# Patient Record
Sex: Female | Born: 1995 | Hispanic: Yes | Marital: Single | Smoking: Never smoker
Health system: Southern US, Community
[De-identification: ages and names within clinical notes are randomized; demographics above are authoritative.]

## PROBLEM LIST (undated history)

## (undated) DIAGNOSIS — J45909 Unspecified asthma, uncomplicated: Secondary | ICD-10-CM

## (undated) HISTORY — PX: NO PAST SURGERIES: SHX2092

---

## 2017-03-24 ENCOUNTER — Ambulatory Visit (INDEPENDENT_AMBULATORY_CARE_PROVIDER_SITE_OTHER): Payer: Self-pay | Admitting: Physician Assistant

## 2017-03-24 ENCOUNTER — Telehealth: Payer: Self-pay | Admitting: General Practice

## 2017-03-24 VITALS — BP 119/76 | HR 58 | Temp 98.1°F | Resp 16 | Ht 62.0 in | Wt 239.8 lb

## 2017-03-24 DIAGNOSIS — J22 Unspecified acute lower respiratory infection: Secondary | ICD-10-CM

## 2017-03-24 DIAGNOSIS — J45909 Unspecified asthma, uncomplicated: Secondary | ICD-10-CM

## 2017-03-24 DIAGNOSIS — J45901 Unspecified asthma with (acute) exacerbation: Secondary | ICD-10-CM

## 2017-03-24 MED ORDER — BENZONATATE 100 MG PO CAPS: 100.0000 mg | ORAL_CAPSULE | Freq: Three times a day (TID) | ORAL | 0 refills | Status: DC | PRN

## 2017-03-24 MED ORDER — GUAIFENESIN ER 1200 MG PO TB12: 1.0000 | ORAL_TABLET | Freq: Two times a day (BID) | ORAL | 1 refills | Status: DC | PRN

## 2017-03-24 MED ORDER — ALBUTEROL SULFATE HFA 108 (90 BASE) MCG/ACT IN AERS: 2.0000 | INHALATION_SPRAY | Freq: Four times a day (QID) | RESPIRATORY_TRACT | 0 refills | Status: AC | PRN

## 2017-03-24 MED ORDER — AZITHROMYCIN 250 MG PO TABS: ORAL_TABLET | ORAL | 0 refills | Status: DC

## 2017-03-24 NOTE — Telephone Encounter (Signed)
Pt just saw stephanie and when she got the pharmacy it stated that they did not receive an rx for an inhaler   Best number [phone]

## 2017-03-24 NOTE — Patient Instructions (Addendum)
I am treating you for a lower respiratory infection.  I am listing some information below about asthma. Take the antibiotic as prescribed.   Make sure you are hydrating well with water.    Asthma, Adult Asthma is a condition of the lungs in which the airways tighten and narrow. Asthma can make it hard to breathe. Asthma cannot be cured, but medicine and lifestyle changes can help control it. Asthma may be started (triggered) by:  Animal skin flakes (dander).  Dust.  Cockroaches.  Pollen.  Mold.  Smoke.  Cleaning products.  Hair sprays or aerosol sprays.  Paint fumes or strong smells.  Cold air, weather changes, and winds.  Crying or laughing hard.  Stress.  Certain medicines or drugs.  Foods, such as dried fruit, potato chips, and sparkling grape juice.  Infections or conditions (colds, flu).  Exercise.  Certain medical conditions or diseases.  Exercise or tiring activities. Follow these instructions at home:  Take medicine as told by your doctor.  Use a peak flow meter as told by your doctor. A peak flow meter is a tool that measures how well the lungs are working.  Record and keep track of the peak flow meter's readings.  Understand and use the asthma action plan. An asthma action plan is a written plan for taking care of your asthma and treating your attacks.  To help prevent asthma attacks:  Do not smoke. Stay away from secondhand smoke.  Change your heating and air conditioning filter often.  Limit your use of fireplaces and wood stoves.  Get rid of pests (such as roaches and mice) and their droppings.  Throw away plants if you see mold on them.  Clean your floors. Dust regularly. Use cleaning products that do not smell.  Have someone vacuum when you are not home. Use a vacuum cleaner with a HEPA filter if possible.  Replace carpet with wood, tile, or vinyl flooring. Carpet can trap animal skin flakes and dust.  Use allergy-proof pillows,  mattress covers, and box spring covers.  Wash bed sheets and blankets every week in hot water and dry them in a dryer.  Use blankets that are made of polyester or cotton.  Clean bathrooms and kitchens with bleach. If possible, have someone repaint the walls in these rooms with mold-resistant paint. Keep out of the rooms that are being cleaned and painted.  Wash hands often. Contact a doctor if:  You have make a whistling sound when breaking (wheeze), have shortness of breath, or have a cough even if taking medicine to prevent attacks.  The colored mucus you cough up (sputum) is thicker than usual.  The colored mucus you cough up changes from clear or white to yellow, green, gray, or bloody.  You have problems from the medicine you are taking such as:  A rash.  Itching.  Swelling.  Trouble breathing.  You need reliever medicines more than 2-3 times a week.  Your peak flow measurement is still at 50-79% of your personal best after following the action plan for 1 hour.  You have a fever. Get help right away if:  You seem to be worse and are not responding to medicine during an asthma attack.  You are short of breath even at rest.  You get short of breath when doing very little activity.  You have trouble eating, drinking, or talking.  You have chest pain.  You have a fast heartbeat.  Your lips or fingernails start to turn blue.  You are light-headed, dizzy, or faint.  Your peak flow is less than 50% of your personal best. This information is not intended to replace advice given to you by your health care provider. Make sure you discuss any questions you have with your health care provider. Document Released: 05/19/2008 Document Revised: 05/08/2016 Document Reviewed: 06/30/2013 Elsevier Interactive Patient Education  2017 [employer].     IF you received an x-ray today, you will receive an invoice from [facility]. Please contact [facility]  at [phone] with questions or concerns regarding your invoice.   IF you received labwork today, you will receive an invoice from [place]. Please contact [facility] at [phone] with questions or concerns regarding your invoice.   Our billing staff will not be able to assist you with questions regarding bills from these companies.  You will be contacted with the lab results as soon as they are available. The fastest way to get your results is to activate your My Chart account. Instructions are located on the last page of this paperwork. If you have not heard from [place] regarding the results in 2 weeks, please contact this office.

## 2017-03-25 NOTE — Progress Notes (Signed)
PRIMARY CARE AT [facility] [address] [place] [address] [phone]  Date:  03/24/2017   Name:  Vanessa Nunez   DOB:  [birth year]   MRN:  [id number]  PCP:  No PCP Per Patient    History of Present Illness:  Vanessa Nunez is a 21 y.o. female patient who presents to PCP with  Chief Complaint  Patient presents with  . Sore Throat    fever, dizziness, headache, cough, nasal/chest congestion, since Wednesday      Patient reports 7 days of fever, headache and cough.  Cough is productive of a yellow sputum.   Productive cough.  No sneezing or watery eyes.  Ear discomfort.  Subjective fever and chills.   Patient may use albuterol as needed for her asthma at least once per day.   Since her symptoms started she has needed to use her inhaler 3-4 times per day.  She ran out of her inhaler yesterday.    There are no active problems to display for this patient.   No past medical history on file.  No past surgical history on file.  Social History  Substance Use Topics  . Smoking status: Never Smoker  . Smokeless tobacco: Never Used  . Alcohol use No    Family History  Problem Relation Age of Onset  . Cancer Paternal Grandmother     No Known Allergies  Medication list has been reviewed and updated.  No current outpatient prescriptions on file prior to visit.   No current facility-administered medications on file prior to visit.     ROS ROS otherwise unremarkable unless listed above.  Physical Examination: BP 119/76 (BP Location: Right Arm, Patient Position: Sitting, Cuff Size: Small)   Pulse (!) 58   Temp 98.1 F (36.7 C) (Oral)   Resp 16   Ht  (1.575 m)   Wt 239 lb 12.8 oz (108.8 kg)   LMP 03/04/2017   SpO2 100%   BMI 43.86 kg/m  Ideal Body Weight: Weight in (lb) to have BMI = 25: 136.4  Physical Exam  Constitutional: She is oriented to person, place, and time. She appears well-developed and well-nourished. No distress.  HENT:  Head:  Normocephalic and atraumatic.  Right Ear: Tympanic membrane, external ear and ear canal normal.  Left Ear: Tympanic membrane, external ear and ear canal normal.  Nose: Mucosal edema and rhinorrhea present. Right sinus exhibits no maxillary sinus tenderness and no frontal sinus tenderness. Left sinus exhibits no maxillary sinus tenderness and no frontal sinus tenderness.  Mouth/Throat: No uvula swelling. No oropharyngeal exudate, posterior oropharyngeal edema or posterior oropharyngeal erythema.  Eyes: Conjunctivae and EOM are normal. Pupils are equal, round, and reactive to light.  Cardiovascular: Normal rate and regular rhythm.  Exam reveals no gallop, no distant heart sounds and no friction rub.   No murmur heard. Pulmonary/Chest: Effort normal and breath sounds normal. No respiratory distress. She has no decreased breath sounds. She has no wheezes. She has no rhonchi.  Lymphadenopathy:       Head (right side): No submandibular, no tonsillar, no preauricular and no posterior auricular adenopathy present.       Head (left side): No submandibular, no tonsillar, no preauricular and no posterior auricular adenopathy present.  Neurological: She is alert and oriented to person, place, and time.  Skin: She is not diaphoretic.  Psychiatric: She has a normal mood and affect. Her behavior is normal.     Assessment and Plan: [name] is a 21 y.o.  female who is here today for sore throat, cough, and congestion.   Will treat for lower respiratory symptoms.  Advised to hydrate well.  Given albuterol refill at this time. Lower respiratory infection (e.g., bronchitis, pneumonia, pneumonitis, pulmonitis) - Plan: benzonatate (TESSALON) 100 MG capsule, azithromycin (ZITHROMAX) 250 MG tablet  Uncomplicated asthma, unspecified asthma severity, unspecified whether persistent - Plan: benzonatate (TESSALON) 100 MG capsule, azithromycin (ZITHROMAX) 250 MG tablet  [name], PA-C [facility] 4/11/20189:20 AM

## 2018-03-16 ENCOUNTER — Encounter: Payer: Self-pay | Admitting: Physician Assistant

## 2020-12-14 ENCOUNTER — Emergency Department (HOSPITAL_COMMUNITY)
Admission: EM | Admit: 2020-12-14 | Discharge: 2020-12-14 | Disposition: A | Discharge status: Home / Self Care | Payer: Self-pay | Attending: Emergency Medicine | Admitting: Emergency Medicine

## 2020-12-14 ENCOUNTER — Encounter (HOSPITAL_COMMUNITY): Payer: Self-pay | Admitting: Emergency Medicine

## 2020-12-14 ENCOUNTER — Other Ambulatory Visit: Payer: Self-pay

## 2020-12-14 DIAGNOSIS — Z5321 Procedure and treatment not carried out due to patient leaving prior to being seen by health care provider: Secondary | ICD-10-CM | POA: Insufficient documentation

## 2020-12-14 DIAGNOSIS — N939 Abnormal uterine and vaginal bleeding, unspecified: Secondary | ICD-10-CM | POA: Insufficient documentation

## 2020-12-14 LAB — I-STAT BETA HCG BLOOD, ED (MC, WL, AP ONLY): I-stat hCG, quantitative: <5 m[IU]/mL (ref <5)

## 2020-12-14 NOTE — ED Notes (Signed)
Pt gave labels to screener and left facility 

## 2020-12-14 NOTE — ED Triage Notes (Signed)
Patient here from home reporting vaginal bleeding since 12/3. Passing clots and pain with urinating.

## 2020-12-15 NOTE — L&D Delivery Note (Signed)
Delivery Note Called to attend delivery.  Upon arrival to the room the baby was being delivered by the RN. At 8:01 AM a viable female was delivered via Vaginal, Spontaneous (Presentation: Right Occiput Anterior).  APGAR: 9, 9; weight pending.  After 1 minute, the cord was clamped and cut. 40 units of pitocin diluted in 1000cc LR was infused rapidly IV.  The placenta separated spontaneously and delivered via CCT and maternal pushing effort.  It was inspected and appears to be intact with a 3 VC.   Anesthesia: Epidural Episiotomy: None Lacerations: 2nd degree Suture Repair: 2.0 vicryl Est. Blood Loss (mL):  125  Mom to postpartum.  Baby to Couplet care / Skin to Skin.  Vanessa Nunez 10/15/2021, 8:23 AM

## 2021-02-25 ENCOUNTER — Emergency Department (HOSPITAL_COMMUNITY)
Admission: EM | Admit: 2021-02-25 | Discharge: 2021-02-25 | Disposition: A | Discharge status: Home / Self Care | Payer: Self-pay | Attending: Emergency Medicine | Admitting: Emergency Medicine

## 2021-02-25 ENCOUNTER — Other Ambulatory Visit: Payer: Self-pay

## 2021-02-25 ENCOUNTER — Encounter (HOSPITAL_COMMUNITY): Payer: Self-pay | Admitting: Obstetrics & Gynecology

## 2021-02-25 ENCOUNTER — Inpatient Hospital Stay (HOSPITAL_COMMUNITY)
Admission: AD | Admit: 2021-02-25 | Discharge: 2021-02-25 | Disposition: A | Discharge status: Home / Self Care | Payer: Self-pay | Attending: Obstetrics & Gynecology | Admitting: Obstetrics & Gynecology

## 2021-02-25 ENCOUNTER — Inpatient Hospital Stay (HOSPITAL_COMMUNITY): Payer: Self-pay

## 2021-02-25 DIAGNOSIS — Z3A08 8 weeks gestation of pregnancy: Secondary | ICD-10-CM | POA: Insufficient documentation

## 2021-02-25 DIAGNOSIS — Z3491 Encounter for supervision of normal pregnancy, unspecified, first trimester: Secondary | ICD-10-CM

## 2021-02-25 DIAGNOSIS — Z6740 Type O blood, Rh positive: Secondary | ICD-10-CM

## 2021-02-25 DIAGNOSIS — O26891 Other specified pregnancy related conditions, first trimester: Secondary | ICD-10-CM | POA: Insufficient documentation

## 2021-02-25 DIAGNOSIS — J45909 Unspecified asthma, uncomplicated: Secondary | ICD-10-CM | POA: Insufficient documentation

## 2021-02-25 DIAGNOSIS — O26899 Other specified pregnancy related conditions, unspecified trimester: Secondary | ICD-10-CM

## 2021-02-25 DIAGNOSIS — Z5321 Procedure and treatment not carried out due to patient leaving prior to being seen by health care provider: Secondary | ICD-10-CM | POA: Insufficient documentation

## 2021-02-25 DIAGNOSIS — R109 Unspecified abdominal pain: Secondary | ICD-10-CM | POA: Insufficient documentation

## 2021-02-25 DIAGNOSIS — Z3A01 Less than 8 weeks gestation of pregnancy: Secondary | ICD-10-CM | POA: Insufficient documentation

## 2021-02-25 DIAGNOSIS — O99511 Diseases of the respiratory system complicating pregnancy, first trimester: Secondary | ICD-10-CM | POA: Insufficient documentation

## 2021-02-25 DIAGNOSIS — Z79899 Other long term (current) drug therapy: Secondary | ICD-10-CM | POA: Insufficient documentation

## 2021-02-25 HISTORY — DX: Unspecified asthma, uncomplicated: J45.909

## 2021-02-25 LAB — CBC
HCT: 38.4 % (ref 36.0–46.0)
Hemoglobin: 12.5 g/dL (ref 12.0–15.0)
MCH: 27.9 pg (ref 26.0–34.0)
MCHC: 32.6 g/dL (ref 30.0–36.0)
MCV: 85.7 fL (ref 80.0–100.0)
Platelets: 312 10*3/uL (ref 150–400)
RBC: 4.48 MIL/uL (ref 3.87–5.11)
RDW: 13.1 % (ref 11.5–15.5)
WBC: 13.5 10*3/uL — ABNORMAL HIGH (ref 4.0–10.5)
nRBC: 0 % (ref 0.0–0.2)

## 2021-02-25 LAB — WET PREP, GENITAL
Clue Cells Wet Prep HPF POC: NONE SEEN
Sperm: NONE SEEN
Trich, Wet Prep: NONE SEEN
Yeast Wet Prep HPF POC: NONE SEEN

## 2021-02-25 LAB — HCG, QUANTITATIVE, PREGNANCY: hCG, Beta Chain, Quant, S: 5439 m[IU]/mL — ABNORMAL HIGH (ref <5)

## 2021-02-25 LAB — POCT PREGNANCY, URINE: Preg Test, Ur: POSITIVE — AB

## 2021-02-25 LAB — URINALYSIS, ROUTINE W REFLEX MICROSCOPIC
Bilirubin Urine: NEGATIVE
Glucose, UA: NEGATIVE mg/dL
Hgb urine dipstick: NEGATIVE
Ketones, ur: NEGATIVE mg/dL
Nitrite: NEGATIVE
Protein, ur: NEGATIVE mg/dL
Specific Gravity, Urine: 1.009 (ref 1.005–1.030)
pH: 7 (ref 5.0–8.0)

## 2021-02-25 LAB — GC/CHLAMYDIA PROBE AMP (~~LOC~~) NOT AT ARMC
Chlamydia: NEGATIVE
Comment: NEGATIVE
Comment: NORMAL
Neisseria Gonorrhea: NEGATIVE

## 2021-02-25 LAB — ABO/RH: ABO/RH(D): O POS

## 2021-02-25 NOTE — Discharge Instructions (Signed)
Safe Medications in Pregnancy   Acne: Benzoyl Peroxide Salicylic Acid  Backache/Headache: Tylenol: 2 regular strength every 4 hours OR              2 Extra strength every 6 hours  Colds/Coughs/Allergies: Benadryl (alcohol free) 25 mg every 6 hours as needed Breath right strips Claritin Cepacol throat lozenges Chloraseptic throat spray Cold-Eeze- up to three times per day Cough drops, alcohol free Flonase (by prescription only) Guaifenesin Mucinex Robitussin DM (plain only, alcohol free) Saline nasal spray/drops Sudafed (pseudoephedrine) & Actifed ** use only after [redacted] weeks gestation and if you do not have high blood pressure Tylenol Vicks Vaporub Zinc lozenges Zyrtec   Constipation: Colace Ducolax suppositories Fleet enema Glycerin suppositories Metamucil Milk of magnesia Miralax Senokot Smooth move tea  Diarrhea: Kaopectate Imodium A-D  *NO pepto Bismol  Hemorrhoids: Anusol Anusol HC Preparation H Tucks  Indigestion: Tums Maalox Mylanta Zantac  Pepcid  Insomnia: Benadryl (alcohol free) 25mg every 6 hours as needed Tylenol PM Unisom, no Gelcaps  Leg Cramps: Tums MagGel  Nausea/Vomiting:  Bonine Dramamine Emetrol Ginger extract Sea bands Meclizine  Nausea medication to take during pregnancy:  Unisom (doxylamine succinate 25 mg tablets) Take one tablet daily at bedtime. If symptoms are not adequately controlled, the dose can be increased to a maximum recommended dose of two tablets daily (1/2 tablet in the morning, 1/2 tablet mid-afternoon and one at bedtime). Vitamin B6 100mg tablets. Take one tablet twice a day (up to 200 mg per day).  Skin Rashes: Aveeno products Benadryl cream or 25mg every 6 hours as needed Calamine Lotion 1% cortisone cream  Yeast infection: Gyne-lotrimin 7 Monistat 7   **If taking multiple medications, please check labels to avoid duplicating the same active ingredients **take medication as directed on  the label ** Do not exceed 4000 mg of tylenol in 24 hours **Do not take medications that contain aspirin or ibuprofen    Obstetrics: Normal and Problem Pregnancies (7th ed., pp. 102-121). [place], [place]: Elsevier."> Textbook of Family Medicine (9th ed., pp. 365-410). [place], [place]: Else[employer].">  First Trimester of Pregnancy  The first trimester of pregnancy starts on the first day of your last menstrual period until the end of week 12. This is months 1 through 3 of pregnancy. A week after a sperm fertilizes an egg, the egg will implant into the wall of the uterus and begin to develop into a baby. By the end of 12 weeks, all the baby's organs will be formed and the baby will be 2-3 inches in size. Body changes during your first trimester Your body goes through many changes during pregnancy. The changes vary and generally return to normal after your baby is born. Physical changes  You may gain or lose weight.  Your breasts may begin to grow larger and become tender. The tissue that surrounds your nipples (areola) may become darker.  Dark spots or blotches (chloasma or mask of pregnancy) may develop on your face.  You may have changes in your hair. These can include thickening or thinning of your hair or changes in texture. Health changes  You may feel nauseous, and you may vomit.  You may have heartburn.  You may develop headaches.  You may develop constipation.  Your gums may bleed and may be sensitive to brushing and flossing. Other changes  You may tire easily.  You may urinate more often.  Your menstrual periods will stop.  You may have a loss of appetite.  You may develop   cravings for certain kinds of food.  You may have changes in your emotions from day to day.  You may have more vivid and strange dreams. Follow these instructions at home: Medicines  Follow your health care provider's instructions regarding medicine use. Specific medicines may be  either safe or unsafe to take during pregnancy. Do not take any medicines unless told to by your health care provider.  Take a prenatal vitamin that contains at least 600 micrograms (mcg) of folic acid. Eating and drinking  Eat a healthy diet that includes fresh fruits and vegetables, whole grains, good sources of protein such as meat, eggs, or tofu, and low-fat dairy products.  Avoid raw meat and unpasteurized juice, milk, and cheese. These carry germs that can harm you and your baby.  If you feel nauseous or you vomit: ? Eat 4 or 5 small meals a day instead of 3 large meals. ? Try eating a few soda crackers. ? Drink liquids between meals instead of during meals.  You may need to take these actions to prevent or treat constipation: ? Drink enough fluid to keep your urine pale yellow. ? Eat foods that are high in fiber, such as beans, whole grains, and fresh fruits and vegetables. ? Limit foods that are high in fat and processed sugars, such as fried or sweet foods. Activity  Exercise only as directed by your health care provider. Most people can continue their usual exercise routine during pregnancy. Try to exercise for 30 minutes at least 5 days a week.  Stop exercising if you develop pain or cramping in the lower abdomen or lower back.  Avoid exercising if it is very hot or humid or if you are at high altitude.  Avoid heavy lifting.  If you choose to, you may have sex unless your health care provider tells you not to. Relieving pain and discomfort  Wear a good support bra to relieve breast tenderness.  Rest with your legs elevated if you have leg cramps or low back pain.  If you develop bulging veins (varicose veins) in your legs: ? Wear support hose as told by your health care provider. ? Elevate your feet for 15 minutes, 3-4 times a day. ? Limit salt in your diet. Safety  Wear your seat belt at all times when driving or riding in a car.  Talk with your health care  provider if someone is verbally or physically abusive to you.  Talk with your health care provider if you are feeling sad or have thoughts of hurting yourself. Lifestyle  Do not use hot tubs, steam rooms, or saunas.  Do not douche. Do not use tampons or scented sanitary pads.  Do not use herbal remedies, alcohol, illegal drugs, or medicines that are not approved by your health care provider. Chemicals in these products can harm your baby.  Do not use any products that contain nicotine or tobacco, such as cigarettes, e-cigarettes, and chewing tobacco. If you need help quitting, ask your health care provider.  Avoid cat litter boxes and soil used by cats. These carry germs that can cause birth defects in the baby and possibly loss of the unborn baby (fetus) by miscarriage or stillbirth. General instructions  During routine prenatal visits in the first trimester, your health care provider will do a physical exam, perform necessary tests, and ask you how things are going. Keep all follow-up visits. This is important.  Ask for help if you have counseling or nutritional needs during pregnancy. Your   health care provider can offer advice or refer you to specialists for help with various needs.  Schedule a dentist appointment. At home, brush your teeth with a soft toothbrush. Floss gently.  Write down your questions. Take them to your prenatal visits. Where to find more information  American Pregnancy Association: americanpregnancy.org  American College of Obstetricians and Gynecologists: acog.org/en/Womens%20Health/Pregnancy  Office on Women's Health: womenshealth.gov/pregnancy Contact a health care provider if you have:  Dizziness.  A fever.  Mild pelvic cramps, pelvic pressure, or nagging pain in the abdominal area.  Nausea, vomiting, or diarrhea that lasts for 24 hours or longer.  A bad-smelling vaginal discharge.  Pain when you urinate.  Known exposure to a contagious illness,  such as chickenpox, measles, Zika virus, HIV, or hepatitis. Get help right away if you have:  Spotting or bleeding from your vagina.  Severe abdominal cramping or pain.  Shortness of breath or chest pain.  Any kind of trauma, such as from a fall or a car crash.  New or increased pain, swelling, or redness in an arm or leg. Summary  The first trimester of pregnancy starts on the first day of your last menstrual period until the end of week 12 (months 1 through 3).  Eating 4 or 5 small meals a day rather than 3 large meals may help to relieve nausea and vomiting.  Do not use any products that contain nicotine or tobacco, such as cigarettes, e-cigarettes, and chewing tobacco. If you need help quitting, ask your health care provider.  Keep all follow-up visits. This is important. This information is not intended to replace advice given to you by your health care provider. Make sure you discuss any questions you have with your health care provider. Document Revised: 05/09/2020 Document Reviewed: 03/15/2020 Elsevier Patient Education  2021 [employer].  

## 2021-02-25 NOTE — MAU Note (Signed)
Pt reports lower abd pain and pelvic pain for the last few hours, some pink discharge. Positive home preg test 2 weeks ago and positive at the [facility].

## 2021-02-25 NOTE — MAU Provider Note (Signed)
History     CSN: [id number]  Arrival date and time: 02/25/21 0204   Event Date/Time   First Provider Initiated Contact with Patient 02/25/21 0250      Chief Complaint  Patient presents with  . Possible Pregnancy  . Abdominal Pain  . Vaginal Bleeding   HPI Vanessa Nunez is a 25 y.o. G1P0 at 5 weeks who presents with pelvic pain and abdominal pain. She states the pain has been ongoing for several days and rates the pain a 5/10. She has not tried any medication for the pain. She reports seeing spotting last night when she wiped but none today. She has not been seen anywhere in this pregnancy.   OB History    Gravida  1   Para      Term      Preterm      AB      Living        SAB      IAB      Ectopic      Multiple      Live Births              Past Medical History:  Diagnosis Date  . Asthma     Past Surgical History:  Procedure Laterality Date  . NO PAST SURGERIES      Family History  Problem Relation Age of Onset  . Cancer Paternal Grandmother     Social History   Tobacco Use  . Smoking status: Never Smoker  . Smokeless tobacco: Never Used  Substance Use Topics  . Alcohol use: No  . Drug use: No    Allergies: No Known Allergies  Medications Prior to Admission  Medication Sig Dispense Refill Last Dose  . albuterol (VENTOLIN HFA) 108 (90 Base) MCG/ACT inhaler Inhale 2 puffs into the lungs every 6 (six) hours as needed for wheezing or shortness of breath. 1 Inhaler 0 Past Month at Unknown time  . Prenatal Vit-Fe Fumarate-FA (MULTIVITAMIN-PRENATAL) 27-0.8 MG TABS tablet Take 1 tablet by mouth daily at 12 noon.   02/24/2021 at Unknown time  . azithromycin (ZITHROMAX) 250 MG tablet Take 2 tabs PO x 1 dose, then 1 tab PO QD x 4 days 6 tablet 0   . benzonatate (TESSALON) 100 MG capsule Take 1-2 capsules (100-200 mg total) by mouth 3 (three) times daily as needed for cough. 40 capsule 0   . Guaifenesin (MUCINEX MAXIMUM STRENGTH) 1200 MG  TB12 Take 1 tablet (1,200 mg total) by mouth every 12 (twelve) hours as needed. 14 tablet 1     Review of Systems  Constitutional: Negative.  Negative for fatigue and fever.  HENT: Negative.   Respiratory: Negative.  Negative for shortness of breath.   Cardiovascular: Negative.  Negative for chest pain.  Gastrointestinal: Positive for abdominal pain. Negative for constipation, diarrhea, nausea and vomiting.  Genitourinary: Positive for vaginal bleeding. Negative for dysuria and vaginal discharge.  Neurological: Negative.  Negative for dizziness and headaches.   Physical Exam   Blood pressure (!) 132/92, pulse 97, temperature 98.8 F (37.1 C), temperature source Oral, resp. rate 17, height 5\' 2"  (1.575 m), weight 117.9 kg, last menstrual period 12/24/2020, SpO2 100 %.  Physical Exam Vitals and nursing note reviewed.  Constitutional:      General: She is not in acute distress.    Appearance: She is well-developed.  HENT:     Head: Normocephalic.  Eyes:     Pupils: Pupils are equal, round, and reactive  to light.  Cardiovascular:     Rate and Rhythm: Normal rate and regular rhythm.     Heart sounds: Normal heart sounds.  Pulmonary:     Effort: Pulmonary effort is normal. No respiratory distress.     Breath sounds: Normal breath sounds.  Abdominal:     General: Bowel sounds are normal. There is no distension.     Palpations: Abdomen is soft.     Tenderness: There is no abdominal tenderness.  Skin:    General: Skin is warm and dry.  Neurological:     Mental Status: She is alert and oriented to person, place, and time.  Psychiatric:        Behavior: Behavior normal.        Thought Content: Thought content normal.        Judgment: Judgment normal.     MAU Course  Procedures Results for orders placed or performed during the hospital encounter of 02/25/21 (from the past 24 hour(s))  Urinalysis, Routine w reflex microscopic     Status: Abnormal   Collection Time: 02/25/21   2:20 AM  Result Value Ref Range   Color, Urine STRAW (A) YELLOW   APPearance HAZY (A) CLEAR   Specific Gravity, Urine 1.009 1.005 - 1.030   pH 7.0 5.0 - 8.0   Glucose, UA NEGATIVE NEGATIVE mg/dL   Hgb urine dipstick NEGATIVE NEGATIVE   Bilirubin Urine NEGATIVE NEGATIVE   Ketones, ur NEGATIVE NEGATIVE mg/dL   Protein, ur NEGATIVE NEGATIVE mg/dL   Nitrite NEGATIVE NEGATIVE   Leukocytes,Ua MODERATE (A) NEGATIVE   RBC / HPF 0-5 0 - 5 RBC/hpf   WBC, UA 0-5 0 - 5 WBC/hpf   Bacteria, UA RARE (A) NONE SEEN   Squamous Epithelial / LPF 0-5 0 - 5  Pregnancy, urine POC     Status: Abnormal   Collection Time: 02/25/21  2:20 AM  Result Value Ref Range   Preg Test, Ur POSITIVE (A) NEGATIVE  CBC     Status: Abnormal   Collection Time: 02/25/21  2:49 AM  Result Value Ref Range   WBC 13.5 (H) 4.0 - 10.5 K/uL   RBC 4.48 3.87 - 5.11 MIL/uL   Hemoglobin 12.5 12.0 - 15.0 g/dL   HCT 28.7 86.7 - 67.2 %   MCV 85.7 80.0 - 100.0 fL   MCH 27.9 26.0 - 34.0 pg   MCHC 32.6 30.0 - 36.0 g/dL   RDW 09.4 70.9 - 62.8 %   Platelets 312 150 - 400 K/uL   nRBC 0.0 0.0 - 0.2 %  hCG, quantitative, pregnancy     Status: Abnormal   Collection Time: 02/25/21  2:49 AM  Result Value Ref Range   hCG, Beta Chain, Quant, S 5,439 (H) <5 mIU/mL  ABO/Rh     Status: None   Collection Time: 02/25/21  2:49 AM  Result Value Ref Range   ABO/RH(D) O POS    No rh immune globuloin      NOT A RH IMMUNE GLOBULIN CANDIDATE, PT RH POSITIVE Performed at [facility], [address] [place], [place] [address]   Wet prep, genital     Status: Abnormal   Collection Time: 02/25/21  2:50 AM   Specimen: Vaginal  Result Value Ref Range   Yeast Wet Prep HPF POC NONE SEEN NONE SEEN   Trich, Wet Prep NONE SEEN NONE SEEN   Clue Cells Wet Prep HPF POC NONE SEEN NONE SEEN   WBC, Wet Prep HPF POC  MANY (A) NONE SEEN   Sperm NONE SEEN    US OB LESS THAN 14 WEEKS WITH OB TRANSVAGINAL  Result Date: 02/25/2021 CLINICAL DATA:  Pelvic  pain, discharge EXAM: OBSTETRIC <14 WK [place] AND TRANSVAGINAL OB US TECHNIQUE: Both transabdominal and transvaginal ultrasound examinations were performed for complete evaluation of the gestation as well as the maternal uterus, adnexal regions, and pelvic cul-de-sac. Transvaginal technique was performed to assess early pregnancy. COMPARISON:  None. FINDINGS: Intrauterine gestational sac: Single Yolk sac:  Visualized. Embryo:  Not Visualized. MSD: 7.1 mm   5 w   3 d Subchorionic hemorrhage:  None visualized. Maternal uterus/adnexae: Left ovary measures 2.1 x 1.5 x 1.1 cm. The right ovary measures 2.8 x 2.6 x 2.4 cm, with likely corpus luteum cyst identified. No free fluid. IMPRESSION: 1. Probable early intrauterine gestational sac and yolk sac, but no fetal pole or cardiac activity yet visualized. Recommend follow-up quantitative B-HCG levels and follow-up US in 14 days to assess viability. This recommendation follows SRU consensus guidelines: Diagnostic Criteria for Nonviable Pregnancy Early in the First Trimester. Malva Limes Med 2013; 409:7353-29. 2. Probable corpus luteum cyst right ovary. Electronically Signed   By: Sharlet Salina M.D.   On: 02/25/2021 03:58   MDM UA, UPT CBC, HCG ABO/Rh- O Pos Wet prep and gc/chlamydia US OB Comp Less 14 weeks with Transvaginal   Assessment and Plan   1. Normal intrauterine pregnancy on prenatal ultrasound in first trimester   2. Abdominal pain affecting pregnancy   3. [redacted] weeks gestation of pregnancy   4. Type O blood, Rh positive    -Discharge home in stable condition -First trimester precautions discussed -Patient advised to follow-up with [facility] in 2 weeks for repeat ultrasound, order placed.  -Patient may return to MAU as needed or if her condition were to change or worsen   Rolm Bookbinder CNM 02/25/2021, 2:50 AM

## 2021-02-25 NOTE — ED Notes (Signed)
[facility]

## 2021-02-27 ENCOUNTER — Inpatient Hospital Stay (HOSPITAL_COMMUNITY)
Admission: AD | Admit: 2021-02-27 | Discharge: 2021-02-28 | Disposition: A | Discharge status: Home / Self Care | Payer: Self-pay | Attending: Obstetrics & Gynecology | Admitting: Obstetrics & Gynecology

## 2021-02-27 ENCOUNTER — Other Ambulatory Visit: Payer: Self-pay

## 2021-02-27 DIAGNOSIS — R102 Pelvic and perineal pain: Secondary | ICD-10-CM | POA: Insufficient documentation

## 2021-02-27 DIAGNOSIS — Z3A09 9 weeks gestation of pregnancy: Secondary | ICD-10-CM | POA: Insufficient documentation

## 2021-02-27 DIAGNOSIS — O26891 Other specified pregnancy related conditions, first trimester: Secondary | ICD-10-CM | POA: Insufficient documentation

## 2021-02-27 DIAGNOSIS — O209 Hemorrhage in early pregnancy, unspecified: Secondary | ICD-10-CM | POA: Insufficient documentation

## 2021-02-27 NOTE — MAU Note (Signed)
Had some spotting this am and it stopped. About ago I had more spotting that was more than this am. Spotting is pink. Mild cramping.

## 2021-02-28 DIAGNOSIS — O4691 Antepartum hemorrhage, unspecified, first trimester: Secondary | ICD-10-CM

## 2021-02-28 DIAGNOSIS — Z3A09 9 weeks gestation of pregnancy: Secondary | ICD-10-CM

## 2021-02-28 LAB — URINALYSIS, ROUTINE W REFLEX MICROSCOPIC
Bacteria, UA: NONE SEEN
Bilirubin Urine: NEGATIVE
Glucose, UA: NEGATIVE mg/dL
Ketones, ur: 5 mg/dL — AB
Leukocytes,Ua: NEGATIVE
Nitrite: NEGATIVE
Protein, ur: NEGATIVE mg/dL
Specific Gravity, Urine: 1.004 — ABNORMAL LOW (ref 1.005–1.030)
pH: 6 (ref 5.0–8.0)

## 2021-02-28 LAB — HCG, QUANTITATIVE, PREGNANCY: hCG, Beta Chain, Quant, S: 9774 m[IU]/mL — ABNORMAL HIGH (ref <5)

## 2021-02-28 NOTE — Discharge Instructions (Signed)
Vaginal Bleeding During Pregnancy, First Trimester A small amount of bleeding from the vagina, or spotting, is common during early pregnancy. Some bleeding may be related to the pregnancy, and some may not. In many cases, the bleeding is normal and is not a problem. However, bleeding can also be a sign of something serious. Normal things that may cause bleeding during the first trimester:  Implantation of the fertilized egg in the lining of the uterus.  Rapid changes in blood vessels. This is caused by changes that are happening to the body during pregnancy.  Sex.  Pelvic exams. Abnormal things that may cause bleeding during the first trimester include:  Infection or inflammation of the cervix.  Growths or polyps on the cervix.  Miscarriage or threatened miscarriage.  Pregnancy that is growing outside of the uterus (ectopic pregnancy).  A fertilized egg that becomes a mass of tissue (molar pregnancy). Tell your health care provider right away if there is any bleeding from your vagina. Follow these instructions at home: Monitoring your bleeding Monitor your bleeding.  Pay attention to any changes in your symptoms. Let your health care provider know about any concerns.  Try to understand when the bleeding occurs. Does the bleeding start on its own, or does it start after something is done, such as sex or a pelvic exam?  Use a diary to record the things you see about your bleeding, including: ? The kind of bleeding you are having. Does the bleeding start and stop irregularly, or is it a constant flow? ? The severity of your bleeding. Is the bleeding heavy or light? ? The number of pads you use each day, how often you change them, and how soaked they are.  Tell your health care provider if you pass tissue. He or she may want to see it.   Activity  Follow instructions from your health care provider about limiting your activity. Ask what activities are safe for you.  Do not have  sex until your health care provider says that this is safe.  If needed, make plans for someone to help with your regular activities. General instructions  Take over-the-counter and prescription medicines only as told by your health care provider.  Do not take aspirin because it can cause bleeding.  Do not use tampons or douche.  Keep all follow-up visits. This is important. Contact a health care provider if:  You have vaginal bleeding during any part of your pregnancy.  You have cramps or labor pains.  You have a fever or chills. Get help right away if:  You have severe cramps in your back or abdomen.  You pass large clots or a large amount of tissue from your vagina.  Your bleeding increases.  You feel light-headed or weak, or you faint.  You are leaking fluid or have a gush of fluid from your vagina. Summary  A small amount of bleeding from the vagina is common during early pregnancy.  Be sure to tell your health care provider about any vaginal bleeding right away.  Try to understand when bleeding occurs. Does bleeding occur on its own, or does it occur after something is done, such as sex or pelvic exams?  Keep all follow-up visits. This is important. This information is not intended to replace advice given to you by your health care provider. Make sure you discuss any questions you have with your health care provider. Document Revised: 08/23/2020 Document Reviewed: 08/23/2020 Elsevier Patient Education  2021 Elsevier Inc.  

## 2021-02-28 NOTE — MAU Provider Note (Addendum)
Chief Complaint: Vaginal Bleeding   Event Date/Time   First Provider Initiated Contact with Patient 02/28/21 0055        SUBJECTIVE HPI: Vanessa Nunez is a 25 y.o. G1P0 at [redacted]w[redacted]d by LMP who presents to maternity admissions reporting spotting this morning and again this evening.  Has some mild cramps. She denies vaginal itching/burning, urinary symptoms, h/a, dizziness, n/v, or fever/chills.    Was seen here 3 days ago with the same symptoms.  They saw a SIUP with yolk sac.  Repeat US was recommended in 2 weeks.    Vaginal Bleeding The patient's primary symptoms include pelvic pain (mild cramping) and vaginal bleeding (spotting). The patient's pertinent negatives include no genital itching, genital lesions or genital odor. This is a recurrent problem. The current episode started today. The problem occurs intermittently. The pain is mild. The problem affects both sides. She is pregnant. Pertinent negatives include no back pain, chills, constipation, fever, nausea or vomiting. The vaginal discharge was bloody. The vaginal bleeding is spotting. She has not been passing clots. She has not been passing tissue. Nothing aggravates the symptoms. She has tried nothing for the symptoms.   RN Note: Had some spotting this am and it stopped. About ago I had more spotting that was more than this am. Spotting is pink. Mild cramping.  Past Medical History:  Diagnosis Date   Asthma    Past Surgical History:  Procedure Laterality Date   NO PAST SURGERIES     Social History   Socioeconomic History   Marital status: Single    Spouse name: Not on file   Number of children: Not on file   Years of education: Not on file   Highest education level: Not on file  Occupational History   Not on file  Tobacco Use   Smoking status: Never Smoker   Smokeless tobacco: Never Used  Substance and Sexual Activity   Alcohol use: No   Drug use: No   Sexual activity: Yes  Other Topics Concern   Not  on file  Social History Narrative   Not on file   Social Determinants of Health   Financial Resource Strain: Not on file  Food Insecurity: Not on file  Transportation Needs: Not on file  Physical Activity: Not on file  Stress: Not on file  Social Connections: Not on file  Intimate Partner Violence: Not on file   No current facility-administered medications on file prior to encounter.   Current Outpatient Medications on File Prior to Encounter  Medication Sig Dispense Refill   albuterol (VENTOLIN HFA) 108 (90 Base) MCG/ACT inhaler Inhale 2 puffs into the lungs every 6 (six) hours as needed for wheezing or shortness of breath. 1 Inhaler 0   Prenatal Vit-Fe Fumarate-FA (MULTIVITAMIN-PRENATAL) 27-0.8 MG TABS tablet Take 1 tablet by mouth daily at 12 noon.     No Known Allergies  I have reviewed patient's Past Medical Hx, Surgical Hx, Family Hx, Social Hx, medications and allergies.   ROS:  Review of Systems  Constitutional: Negative for chills and fever.  Gastrointestinal: Negative for constipation, nausea and vomiting.  Genitourinary: Positive for pelvic pain (mild cramping) and vaginal bleeding.  Musculoskeletal: Negative for back pain.   Review of Systems  Other systems negative   Physical Exam  Physical Exam Patient Vitals for the past 24 hrs:  BP Temp Pulse Resp Height Weight  02/27/21 2219 125/65 -- 75 -- -- --  02/27/21 2217 -- 98.3 F (36.8 C) -- 17  5\' 2"  (1.575 m) 118.4 kg   Constitutional: Well-developed, well-nourished female in no acute distress.  Cardiovascular: normal rate Respiratory: normal effort GI: Abd soft, non-tender.  MS: Extremities nontender, no edema, normal ROM Neurologic: Alert and oriented x 4.  GU: Neg CVAT.  PELVIC EXAM:  Not repeated due to having had exam 3 days ago   LAB RESULTS Results for orders placed or performed during the hospital encounter of 02/27/21 (from the past 24 hour(s))  Urinalysis, Routine w reflex microscopic  Urine, Clean Catch     Status: Abnormal   Collection Time: 02/27/21 10:23 PM  Result Value Ref Range   Color, Urine STRAW (A) YELLOW   APPearance CLEAR CLEAR   Specific Gravity, Urine 1.004 (L) 1.005 - 1.030   pH 6.0 5.0 - 8.0   Glucose, UA NEGATIVE NEGATIVE mg/dL   Hgb urine dipstick SMALL (A) NEGATIVE   Bilirubin Urine NEGATIVE NEGATIVE   Ketones, ur 5 (A) NEGATIVE mg/dL   Protein, ur NEGATIVE NEGATIVE mg/dL   Nitrite NEGATIVE NEGATIVE   Leukocytes,Ua NEGATIVE NEGATIVE   RBC / HPF 0-5 0 - 5 RBC/hpf   WBC, UA 0-5 0 - 5 WBC/hpf   Bacteria, UA NONE SEEN NONE SEEN   Squamous Epithelial / LPF 0-5 0 - 5  hCG, quantitative, pregnancy     Status: Abnormal   Collection Time: 02/28/21 12:09 AM  Result Value Ref Range   hCG, Beta Chain, Quant, S 9,774 (H) <5 mIU/mL    Ref. Range 02/25/2021 02:49  HCG, Beta Chain, Quant, S Latest Ref Range: <5 mIU/mL 5,439 (H)   --/--/O POS (03/14 0249)  IMAGING (3 days ago) 12-05-1977 OB LESS THAN 14 WEEKS WITH OB TRANSVAGINAL  Result Date: 02/25/2021 CLINICAL DATA:  Pelvic pain, discharge EXAM: OBSTETRIC <14 WK 02/27/2021 AND TRANSVAGINAL OB US TECHNIQUE: Both transabdominal and transvaginal ultrasound examinations were performed for complete evaluation of the gestation as well as the maternal uterus, adnexal regions, and pelvic cul-de-sac. Transvaginal technique was performed to assess early pregnancy. COMPARISON:  None. FINDINGS: Intrauterine gestational sac: Single Yolk sac:  Visualized. Embryo:  Not Visualized. MSD: 7.1 mm   5 w   3 d Subchorionic hemorrhage:  None visualized. Maternal uterus/adnexae: Left ovary measures 2.1 x 1.5 x 1.1 cm. The right ovary measures 2.8 x 2.6 x 2.4 cm, with likely corpus luteum cyst identified. No free fluid. IMPRESSION: 1. Probable early intrauterine gestational sac and yolk sac, but no fetal pole or cardiac activity yet visualized. Recommend follow-up quantitative B-HCG levels and follow-up US in 14 days to assess viability. This  recommendation follows SRU consensus guidelines: Diagnostic Criteria for Nonviable Pregnancy Early in the First Trimester. [place] Med 20132014. 2. Probable corpus luteum cyst right ovary. Electronically Signed   By: ; [id number] M.D.   On: 02/25/2021 03:58    MAU Management/MDM: Ordered repeat Quantitative HCG to monitor for any drop in level >> level went up Discussed 02/27/2021 done 3 days ago was reassuring but not confirmatory for viable pregnancy until we can see the fetus Reviewed plan for repeat US in 2 weeks  Has appt at [facility] on April 5  ASSESSMENT Pregnancy at [redacted]w[redacted]d by LMP, [redacted]w[redacted]d by [redacted]w[redacted]d Yolk sac seen 3 days ago Bleeding in first trimester, unclear etiology  PLAN Discharge home Plan to repeat  Ultrasound in about 7-10 days  SAB precautions   Pt stable at time of discharge. Encouraged to return here if she develops worsening of symptoms, increase in pain,  fever, or other concerning symptoms.    Wynelle Bourgeois CNM, MSN Certified Nurse-Midwife 02/28/2021  12:55 AM  Attestation of Attending Supervision of Advanced Practice Provider (PA/CNM/NP): Evaluation and management procedures were performed by the Advanced Practice Provider under my supervision and collaboration.  I have reviewed the Advanced Practice Provider's note and chart, and I agree with the management and plan. I have also made any necessary editorial changes.   Sharon Seller, DO Attending Obstetrician & Gynecologist, [facility] for [facility], [facility] 02/28/2021 1:51 PM

## 2021-02-28 NOTE — Progress Notes (Signed)
Wynelle Bourgeois CNM in Triage to discuss lab report and d/c plan with pt. Written and verbal d/c instructions given and understanding voiced.

## 2021-03-29 ENCOUNTER — Inpatient Hospital Stay (HOSPITAL_COMMUNITY)
Admission: AD | Admit: 2021-03-29 | Discharge: 2021-03-30 | Disposition: A | Discharge status: Home / Self Care | Payer: Self-pay | Attending: Obstetrics & Gynecology | Admitting: Obstetrics & Gynecology

## 2021-03-29 ENCOUNTER — Other Ambulatory Visit: Payer: Self-pay

## 2021-03-29 ENCOUNTER — Encounter (HOSPITAL_COMMUNITY): Payer: Self-pay | Admitting: Obstetrics & Gynecology

## 2021-03-29 DIAGNOSIS — Z3A13 13 weeks gestation of pregnancy: Secondary | ICD-10-CM | POA: Insufficient documentation

## 2021-03-29 DIAGNOSIS — O26891 Other specified pregnancy related conditions, first trimester: Secondary | ICD-10-CM | POA: Insufficient documentation

## 2021-03-29 DIAGNOSIS — R102 Pelvic and perineal pain: Secondary | ICD-10-CM | POA: Insufficient documentation

## 2021-03-29 DIAGNOSIS — J45909 Unspecified asthma, uncomplicated: Secondary | ICD-10-CM | POA: Insufficient documentation

## 2021-03-29 DIAGNOSIS — O26892 Other specified pregnancy related conditions, second trimester: Secondary | ICD-10-CM

## 2021-03-29 DIAGNOSIS — O99511 Diseases of the respiratory system complicating pregnancy, first trimester: Secondary | ICD-10-CM | POA: Insufficient documentation

## 2021-03-29 DIAGNOSIS — Z79899 Other long term (current) drug therapy: Secondary | ICD-10-CM | POA: Insufficient documentation

## 2021-03-29 DIAGNOSIS — R109 Unspecified abdominal pain: Secondary | ICD-10-CM | POA: Insufficient documentation

## 2021-03-29 DIAGNOSIS — B9689 Other specified bacterial agents as the cause of diseases classified elsewhere: Secondary | ICD-10-CM

## 2021-03-29 LAB — WET PREP, GENITAL
Sperm: NONE SEEN
Trich, Wet Prep: NONE SEEN
Yeast Wet Prep HPF POC: NONE SEEN

## 2021-03-29 LAB — URINALYSIS, ROUTINE W REFLEX MICROSCOPIC
Bacteria, UA: NONE SEEN
Bilirubin Urine: NEGATIVE
Glucose, UA: NEGATIVE mg/dL
Hgb urine dipstick: NEGATIVE
Ketones, ur: 80 mg/dL — AB
Nitrite: NEGATIVE
Protein, ur: NEGATIVE mg/dL
Specific Gravity, Urine: 1.021 (ref 1.005–1.030)
pH: 7 (ref 5.0–8.0)

## 2021-03-29 MED ORDER — ACETAMINOPHEN 500 MG PO TABS
1000.0000 mg | ORAL_TABLET | Freq: Once | ORAL | Status: AC
  Administered 2021-03-29: 1000 mg via ORAL
  Filled 2021-03-29: qty 2

## 2021-03-29 NOTE — MAU Note (Signed)
PT SAYS SHE HURTS WHEN URINATES - STARTED TODAY .  PNC WITH [facility]

## 2021-03-29 NOTE — MAU Provider Note (Signed)
History     CSN: [id number]  Arrival date and time: 03/29/21 2158   Event Date/Time   First Provider Initiated Contact with Patient 03/29/21 2322      No chief complaint on file.  Vanessa Nunez is a 25 y.o. G1P0 at [redacted]w[redacted]d who receives care at [facility].  She presents today for Pelvic and Abdominal Pain.  She states the pain is located on her left side and started today around 4pm.  She states the pain is constant and describes it "like period cramps that hurt really bad, but a little bit worse."  She states the pain is worsened with urination, but has not relieving factors. She states she has not taken anything for the pain because she "wasn't sure what to take."    OB History    Gravida  1   Para      Term      Preterm      AB      Living        SAB      IAB      Ectopic      Multiple      Live Births              Past Medical History:  Diagnosis Date  . Asthma     Past Surgical History:  Procedure Laterality Date  . NO PAST SURGERIES      Family History  Problem Relation Age of Onset  . Cancer Paternal Grandmother     Social History   Tobacco Use  . Smoking status: Never Smoker  . Smokeless tobacco: Never Used  Substance Use Topics  . Alcohol use: No  . Drug use: No    Allergies: No Known Allergies  Medications Prior to Admission  Medication Sig Dispense Refill Last Dose  . albuterol (VENTOLIN HFA) 108 (90 Base) MCG/ACT inhaler Inhale 2 puffs into the lungs every 6 (six) hours as needed for wheezing or shortness of breath. 1 Inhaler 0 03/28/2021 at Unknown time  . Prenatal Vit-Fe Fumarate-FA (MULTIVITAMIN-PRENATAL) 27-0.8 MG TABS tablet Take 1 tablet by mouth daily at 12 noon.   03/27/2021    Review of Systems  Constitutional: Negative for chills and fever.  Eyes: Negative for visual disturbance.  Respiratory: Negative for cough and shortness of breath.   Gastrointestinal: Positive for abdominal pain (Cramping). Negative for  constipation, diarrhea, nausea and vomiting.  Genitourinary: Positive for difficulty urinating and pelvic pain (cramping). Negative for dysuria, vaginal bleeding and vaginal discharge.  Neurological: Negative for dizziness, light-headedness and headaches.   Physical Exam   Blood pressure 117/70, pulse 95, temperature 99 F (37.2 C), temperature source Oral, resp. rate 18, height 5\' 2"  (1.575 m), weight 115.8 kg, last menstrual period 12/24/2020.  Physical Exam Vitals reviewed. Exam conducted with a chaperone present.  Constitutional:      General: She is not in acute distress.    Appearance: Normal appearance. She is obese. She is not toxic-appearing.  HENT:     Head: Normocephalic and atraumatic.  Eyes:     Conjunctiva/sclera: Conjunctivae normal.  Cardiovascular:     Rate and Rhythm: Normal rate and regular rhythm.     Heart sounds: Normal heart sounds.  Pulmonary:     Effort: Pulmonary effort is normal. No respiratory distress.     Breath sounds: Normal breath sounds.  Abdominal:     General: Bowel sounds are normal.     Palpations: Abdomen is soft.  Tenderness: There is abdominal tenderness in the left lower quadrant. There is no right CVA tenderness, left CVA tenderness, guarding or rebound.  Genitourinary:    General: Normal vulva.     Comments: NEFG Swabs collected blindly. BME with +tenderness in cul de sac.  Some mild CMT. Cervix closed. Moderate amt whitish discharge noted on glove Musculoskeletal:        General: Normal range of motion.     Cervical back: Normal range of motion.  Skin:    General: Skin is warm and dry.  Neurological:     Mental Status: She is alert and oriented to person, place, and time.  Psychiatric:        Mood and Affect: Mood normal.        Behavior: Behavior normal.        Thought Content: Thought content normal.     MAU Course  Procedures Results for orders placed or performed during the hospital encounter of 03/29/21 (from the  past 24 hour(s))  Urinalysis, Routine w reflex microscopic Urine, Clean Catch     Status: Abnormal   Collection Time: 03/29/21 10:36 PM  Result Value Ref Range   Color, Urine YELLOW YELLOW   APPearance HAZY (A) CLEAR   Specific Gravity, Urine 1.021 1.005 - 1.030   pH 7.0 5.0 - 8.0   Glucose, UA NEGATIVE NEGATIVE mg/dL   Hgb urine dipstick NEGATIVE NEGATIVE   Bilirubin Urine NEGATIVE NEGATIVE   Ketones, ur 80 (A) NEGATIVE mg/dL   Protein, ur NEGATIVE NEGATIVE mg/dL   Nitrite NEGATIVE NEGATIVE   Leukocytes,Ua TRACE (A) NEGATIVE   RBC / HPF 0-5 0 - 5 RBC/hpf   WBC, UA 6-10 0 - 5 WBC/hpf   Bacteria, UA NONE SEEN NONE SEEN   Squamous Epithelial / LPF 0-5 0 - 5   Mucus PRESENT   Wet prep, genital     Status: Abnormal   Collection Time: 03/29/21 11:34 PM   Specimen: PATH Cytology Cervicovaginal Ancillary Only  Result Value Ref Range   Yeast Wet Prep HPF POC NONE SEEN NONE SEEN   Trich, Wet Prep NONE SEEN NONE SEEN   Clue Cells Wet Prep HPF POC PRESENT (A) NONE SEEN   WBC, Wet Prep HPF POC MANY (A) NONE SEEN   Sperm NONE SEEN    MDM Exam Labs: UA, UC Analgesic Assessment and Plan  25 year old, G1P0  SIUP at 13.4 weeks Abdominal Pain Pelvic Pain  -Reviewed POC with patient. -Exam performed and findings discussed.  -Patient offered and accepts pain medication. -Cultures collected and pending.  -Will await results and reassess.    Cherre Robins 03/29/2021, 11:22 PM   Reassessment (12:13 AM)  -Results return positive for clue cells. -Provider to bedside to discuss. -Informed of significance of findings and how they can causes current complaints/symptoms. -Educated on BV including how it is caused, how to prevent, and treatment options.  -Discussed treatment with vaginal gel and patient without questions or concerns. -Patient reports improvement of pain with tylenol dosing.  -Given list of pregnancy safe medications for home usage.  -Encouraged to call or return to MAU  if symptoms worsen or with the onset of new symptoms. -Discharged to home in stable condition.   Cherre Robins MSN, CNM Advanced Practice Provider, [facility] [employer] [facility]

## 2021-03-30 DIAGNOSIS — Z3A13 13 weeks gestation of pregnancy: Secondary | ICD-10-CM

## 2021-03-30 DIAGNOSIS — O26891 Other specified pregnancy related conditions, first trimester: Secondary | ICD-10-CM

## 2021-03-30 DIAGNOSIS — R109 Unspecified abdominal pain: Secondary | ICD-10-CM

## 2021-03-30 MED ORDER — METRONIDAZOLE 0.75 % VA GEL: 1.0000 | Freq: Every day | VAGINAL | 0 refills | Status: DC

## 2021-03-30 NOTE — Discharge Instructions (Signed)
Bacterial Vaginosis  Bacterial vaginosis is an infection that occurs when the normal balance of bacteria in the vagina changes. This change is caused by an overgrowth of certain bacteria in the vagina. Bacterial vaginosis is the most common vaginal infection among females aged 24 to 44 years. This condition increases the risk of sexually transmitted infections (STIs). Treatment can help reduce this risk. Treatment is very important for pregnant women because this condition can cause babies to be born early (prematurely) or at a low birth weight. What are the causes? This condition is caused by an increase in harmful bacteria that are normally present in small amounts in the vagina. However, the exact reason this condition develops is not known. You cannot get bacterial vaginosis from toilet seats, bedding, swimming pools, or contact with objects around you. What increases the risk? The following factors may make you more likely to develop this condition:  Having a new sexual partner or multiple sexual partners, or having unprotected sex.  Douching.  Having an intrauterine device (IUD).  Smoking.  Abusing drugs and alcohol. This may lead to riskier sexual behavior.  Taking certain antibiotic medicines.  Being pregnant. What are the signs or symptoms? Some women with this condition have no symptoms. Symptoms may include:  Gray or white vaginal discharge. The discharge can be watery or foamy.  A fish-like odor with discharge, especially after sex or during menstruation.  Itching in and around the vagina.  Burning or pain with urination. How is this diagnosed? This condition is diagnosed based on:  Your medical history.  A physical exam of the vagina.  Checking a sample of vaginal fluid for harmful bacteria or abnormal cells. How is this treated? This condition is treated with antibiotic medicines. These may be given as a pill, a vaginal cream, or a medicine that is put into the  vagina (suppository). If the condition comes back after treatment, a second round of antibiotics may be needed. Follow these instructions at home: Medicines  Take or apply over-the-counter and prescription medicines only as told by your health care provider.  Take or apply your antibiotic medicine as told by your health care provider. Do not stop using the antibiotic even if you start to feel better. General instructions  If you have a female sexual partner, tell her that you have a vaginal infection. She should follow up with her health care provider. If you have a female sexual partner, he does not need treatment.  Avoid sexual activity until you finish treatment.  Drink enough fluid to keep your urine pale yellow.  Keep the area around your vagina and rectum clean. ? Wash the area daily with warm water. ? Wipe yourself from front to back after using the toilet.  If you are breastfeeding, talk to your health care provider about continuing breastfeeding during treatment.  Keep all follow-up visits. This is important. How is this prevented? Self-care  Do not douche.  Wash the outside of your vagina with warm water only.  Wear cotton or cotton-lined underwear.  Avoid wearing tight pants and pantyhose, especially during the summer. Safe sex  Use protection when having sex. This includes: ? Using condoms. ? Using dental dams. This is a thin layer of a material made of latex or polyurethane that protects the mouth during oral sex.  Limit the number of sexual partners. To help prevent bacterial vaginosis, it is best to have sex with just one partner (monogamous relationship).  Make sure you and your sexual partner   are tested for STIs. Drugs and alcohol  Do not use any products that contain nicotine or tobacco. These products include cigarettes, chewing tobacco, and vaping devices, such as e-cigarettes. If you need help quitting, ask your health care provider.  Do not use  drugs.  Do not drink alcohol if: ? Your health care provider tells you not to do this. ? You are pregnant, may be pregnant, or are planning to become pregnant.  If you drink alcohol: ? Limit how much you have to 0-1 drink a day. ? Be aware of how much alcohol is in your drink. In the [place] one drink equals one 12 oz bottle of beer (355 mL), one 5 oz glass of wine (148 mL), or one 1 oz glass of hard liquor (44 mL). Where to find more information  Centers for Disease Control and Prevention: FootballExhibition.com.br  American Sexual Health Association (ASHA): www.ashastd.org  [place] Department of Health and Health and safety inspector, Office on Women's Health: http://hoffman.com/ Contact a health care provider if:  Your symptoms do not improve, even after treatment.  You have more discharge or pain when urinating.  You have a fever or chills.  You have pain in your abdomen or pelvis.  You have pain during sex.  You have vaginal bleeding between menstrual periods. Summary  Bacterial vaginosis is a vaginal infection that occurs when the normal balance of bacteria in the vagina changes. It results from an overgrowth of certain bacteria.  This condition increases the risk of sexually transmitted infections (STIs). Getting treated can help reduce this risk.  Treatment is very important for pregnant women because this condition can cause babies to be born early (prematurely) or at low birth weight.  This condition is treated with antibiotic medicines. These may be given as a pill, a vaginal cream, or a medicine that is put into the vagina (suppository). This information is not intended to replace advice given to you by your health care provider. Make sure you discuss any questions you have with your health care provider. Document Revised: 05/31/2020 Document Reviewed: 05/31/2020 Elsevier Patient Education  2021 Elsevier Inc. Safe Medications in Pregnancy   Acne: Benzoyl Peroxide Salicylic  Acid  Backache/Headache: Tylenol: 2 regular strength every 4 hours OR              2 Extra strength every 6 hours  Colds/Coughs/Allergies: Benadryl (alcohol free) 25 mg every 6 hours as needed Breath right strips Claritin Cepacol throat lozenges Chloraseptic throat spray Cold-Eeze- up to three times per day Cough drops, alcohol free Flonase (by prescription only) Guaifenesin Mucinex Robitussin DM (plain only, alcohol free) Saline nasal spray/drops Sudafed (pseudoephedrine) & Actifed ** use only after [redacted] weeks gestation and if you do not have high blood pressure Tylenol Vicks Vaporub Zinc lozenges Zyrtec   Constipation: Colace Ducolax suppositories Fleet enema Glycerin suppositories Metamucil Milk of magnesia Miralax Senokot Smooth move tea  Diarrhea: Kaopectate Imodium A-D  *NO pepto Bismol  Hemorrhoids: Anusol Anusol HC Preparation H Tucks  Indigestion: Tums Maalox Mylanta Zantac  Pepcid  Insomnia: Benadryl (alcohol free) 25mg  every 6 hours as needed Tylenol PM Unisom, no Gelcaps  Leg Cramps: Tums MagGel  Nausea/Vomiting:  Bonine Dramamine Emetrol Ginger extract Sea bands Meclizine  Nausea medication to take during pregnancy:  Unisom (doxylamine succinate 25 mg tablets) Take one tablet daily at bedtime. If symptoms are not adequately controlled, the dose can be increased to a maximum recommended dose of two tablets daily (1/2 tablet in the morning, 1/2 tablet  mid-afternoon and one at bedtime). Vitamin B6 100mg  tablets. Take one tablet twice a day (up to 200 mg per day).  Skin Rashes: Aveeno products Benadryl cream or 25mg  every 6 hours as needed Calamine Lotion 1% cortisone cream  Yeast infection: Gyne-lotrimin 7 Monistat 7  Gum/tooth pain: Anbesol  **If taking multiple medications, please check labels to avoid duplicating the same active ingredients **take medication as directed on the label ** Do not exceed 4000 mg of  tylenol in 24 hours **Do not take medications that contain aspirin or ibuprofen

## 2021-03-31 LAB — CULTURE, OB URINE: Culture: 20000 — AB

## 2021-04-01 LAB — GC/CHLAMYDIA PROBE AMP (~~LOC~~) NOT AT ARMC
Chlamydia: NEGATIVE
Comment: NEGATIVE
Comment: NORMAL
Neisseria Gonorrhea: NEGATIVE

## 2021-04-20 ENCOUNTER — Other Ambulatory Visit: Payer: Self-pay

## 2021-04-20 ENCOUNTER — Inpatient Hospital Stay (HOSPITAL_BASED_OUTPATIENT_CLINIC_OR_DEPARTMENT_OTHER): Payer: Self-pay

## 2021-04-20 ENCOUNTER — Encounter (HOSPITAL_COMMUNITY): Payer: Self-pay | Admitting: Obstetrics and Gynecology

## 2021-04-20 ENCOUNTER — Inpatient Hospital Stay (HOSPITAL_COMMUNITY)
Admission: AD | Admit: 2021-04-20 | Discharge: 2021-04-20 | Disposition: A | Discharge status: Home / Self Care | Payer: Self-pay | Attending: Obstetrics and Gynecology | Admitting: Obstetrics and Gynecology

## 2021-04-20 DIAGNOSIS — O4692 Antepartum hemorrhage, unspecified, second trimester: Secondary | ICD-10-CM

## 2021-04-20 DIAGNOSIS — O26892 Other specified pregnancy related conditions, second trimester: Secondary | ICD-10-CM | POA: Insufficient documentation

## 2021-04-20 DIAGNOSIS — O209 Hemorrhage in early pregnancy, unspecified: Secondary | ICD-10-CM | POA: Insufficient documentation

## 2021-04-20 DIAGNOSIS — Z3A16 16 weeks gestation of pregnancy: Secondary | ICD-10-CM | POA: Insufficient documentation

## 2021-04-20 DIAGNOSIS — R102 Pelvic and perineal pain: Secondary | ICD-10-CM | POA: Insufficient documentation

## 2021-04-20 DIAGNOSIS — O99212 Obesity complicating pregnancy, second trimester: Secondary | ICD-10-CM | POA: Insufficient documentation

## 2021-04-20 DIAGNOSIS — O26899 Other specified pregnancy related conditions, unspecified trimester: Secondary | ICD-10-CM

## 2021-04-20 DIAGNOSIS — R109 Unspecified abdominal pain: Secondary | ICD-10-CM

## 2021-04-20 LAB — URINALYSIS, ROUTINE W REFLEX MICROSCOPIC
Bacteria, UA: NONE SEEN
Bilirubin Urine: NEGATIVE
Glucose, UA: NEGATIVE mg/dL
Ketones, ur: NEGATIVE mg/dL
Leukocytes,Ua: NEGATIVE
Nitrite: NEGATIVE
Protein, ur: NEGATIVE mg/dL
Specific Gravity, Urine: 1.014 (ref 1.005–1.030)
pH: 7 (ref 5.0–8.0)

## 2021-04-20 LAB — WET PREP, GENITAL
Sperm: NONE SEEN
Trich, Wet Prep: NONE SEEN
WBC, Wet Prep HPF POC: NONE SEEN
Yeast Wet Prep HPF POC: NONE SEEN

## 2021-04-20 LAB — HIV ANTIBODY (ROUTINE TESTING W REFLEX): HIV Screen 4th Generation wRfx: NONREACTIVE

## 2021-04-20 NOTE — MAU Note (Signed)
Pt stated that she had some cramping earlier this morning in her lower abd . It went away but started up again a few hours ago. She went to the BR and had some dark red blood when she wipes . Still feeling the cramping.Denies any recent intercourse.

## 2021-04-20 NOTE — Discharge Instructions (Signed)
Return to MAU:  If you have heavier bleeding that soaks through more that 2 pads per hour for an hour or more  If you bleed so much that you feel like you might pass out or you do pass out  If you have significant abdominal pain that is not improved with Tylenol 1000 mg every 6 hours as needed for pain  If you develop a fever > 100.5   

## 2021-04-20 NOTE — MAU Provider Note (Addendum)
History     CSN: [id number]  Arrival date and time: 04/20/21 0118   Event Date/Time   First Provider Initiated Contact with Patient 04/20/21 0211      Chief Complaint  Patient presents with  . Vaginal Bleeding  . Abdominal Pain   Vanessa Nunez is a 25 y.o. year old G1P0 female at [redacted]w[redacted]d weeks gestation who presents to MAU reporting cramping earlier yesterday morning and it went away. She reports the pain started back a few hours ago. She also reports seeing dark blood when she wiped after using the BR. She denies recent SI; last was 3 weeks ago. She has not established [facility], because the [facility] told her she could make an appointment after Medicaid was approved. She reports her Medicaid was denied, so she now has to apply for the [employer] Program. Her S.O. is present and contributing to the history taking.    OB History    Gravida  1   Para      Term      Preterm      AB      Living        SAB      IAB      Ectopic      Multiple      Live Births              Past Medical History:  Diagnosis Date  . Asthma     Past Surgical History:  Procedure Laterality Date  . NO PAST SURGERIES      Family History  Problem Relation Age of Onset  . Cancer Paternal Grandmother     Social History   Tobacco Use  . Smoking status: Never Smoker  . Smokeless tobacco: Never Used  Substance Use Topics  . Alcohol use: No  . Drug use: No    Allergies: No Known Allergies  Medications Prior to Admission  Medication Sig Dispense Refill Last Dose  . albuterol (VENTOLIN HFA) 108 (90 Base) MCG/ACT inhaler Inhale 2 puffs into the lungs every 6 (six) hours as needed for wheezing or shortness of breath. 1 Inhaler 0 Past Week at Unknown time  . Prenatal Vit-Fe Fumarate-FA (MULTIVITAMIN-PRENATAL) 27-0.8 MG TABS tablet Take 1 tablet by mouth daily at 12 noon.   04/19/2021 at Unknown time  . metroNIDAZOLE (METROGEL VAGINAL) 0.75 % vaginal gel Place 1 Applicatorful  vaginally at bedtime. Insert one applicator, at bedtime, for 5 nights. 70 g 0     Review of Systems  Constitutional: Negative.   HENT: Negative.   Eyes: Negative.   Respiratory: Negative.   Cardiovascular: Negative.   Gastrointestinal: Negative.   Endocrine: Negative.   Genitourinary: Positive for pelvic pain (cramping earlier yesterday morning, went away, but started back a few hours ago.) and vaginal bleeding (dark blood with wiping).  Musculoskeletal: Negative.   Skin: Negative.   Allergic/Immunologic: Negative.   Neurological: Negative.   Hematological: Negative.   Psychiatric/Behavioral: Negative.    Physical Exam   Blood pressure (!) 136/59, pulse 94, temperature 98.5 F (36.9 C), resp. rate 18, height 5\' 2"  (1.575 m), weight 116.6 kg, last menstrual period 12/24/2020.  Physical Exam Vitals and nursing note reviewed. Exam conducted with a chaperone present.  Constitutional:      Appearance: Normal appearance. She is obese.  Cardiovascular:     Rate and Rhythm: Normal rate.  Abdominal:     Palpations: Abdomen is soft.  Genitourinary:    General: Normal vulva.  Comments: Pelvic exam: External genitalia normal, SE: vaginal walls pink and well rugated, cervix is smooth, pink, no lesions, moderate amt of clear, white vaginal d/c with small dark residual blood mixed in -- WP, GC/CT done, cervix visually closed, Uterus is non-tender, no CMT or friability, bilateral adnexal tenderness.  Skin:    General: Skin is warm and dry.  Neurological:     Mental Status: She is alert and oriented to person, place, and time.  Psychiatric:        Mood and Affect: Mood normal.        Behavior: Behavior normal.        Thought Content: Thought content normal.        Judgment: Judgment normal.   FHTs by doppler: 150 bpm  MAU Course  Procedures  MDM Wet Prep GC/CT -- Results pending  OB MFM Limited U/S  Results for orders placed or performed during the hospital encounter of  04/20/21 (from the past 24 hour(s))  Wet prep, genital     Status: Abnormal   Collection Time: 04/20/21  2:20 AM   Specimen: Cervix  Result Value Ref Range   Yeast Wet Prep HPF POC NONE SEEN NONE SEEN   Trich, Wet Prep NONE SEEN NONE SEEN   Clue Cells Wet Prep HPF POC PRESENT (A) NONE SEEN   WBC, Wet Prep HPF POC NONE SEEN NONE SEEN   Sperm NONE SEEN          Assessment and Plan  Vaginal bleeding in pregnancy, second trimester - Information provided on VB - Return to MAU:  If you have heavier bleeding that soaks through more that 2 pads per hour for an hour or more  If you bleed so much that you feel like you might pass out or you do pass out  If you have significant abdominal pain that is not improved with Tylenol 1000 mg every 6 hours as needed for pain  If you develop a fever > 100.5  Abdominal pain affecting pregnancy  - Information provided on abdominal pain in pregnancy    [redacted] weeks gestation of pregnancy   - Discharge home - Get started with [facility] ASAP - Patient verbalized an understanding of the plan of care and agrees.   Vanessa Nunez, CNM 04/20/2021, 2:11 AM

## 2021-04-21 DIAGNOSIS — Z3A16 16 weeks gestation of pregnancy: Secondary | ICD-10-CM

## 2021-04-21 DIAGNOSIS — O26892 Other specified pregnancy related conditions, second trimester: Secondary | ICD-10-CM

## 2021-04-21 DIAGNOSIS — O4692 Antepartum hemorrhage, unspecified, second trimester: Secondary | ICD-10-CM

## 2021-04-21 DIAGNOSIS — R102 Pelvic and perineal pain: Secondary | ICD-10-CM

## 2021-04-21 DIAGNOSIS — O321XX0 Maternal care for breech presentation, not applicable or unspecified: Secondary | ICD-10-CM

## 2021-04-21 DIAGNOSIS — O99212 Obesity complicating pregnancy, second trimester: Secondary | ICD-10-CM

## 2021-04-22 LAB — GC/CHLAMYDIA PROBE AMP (~~LOC~~) NOT AT ARMC
Chlamydia: NEGATIVE
Comment: NEGATIVE
Comment: NORMAL
Neisseria Gonorrhea: NEGATIVE

## 2021-06-06 LAB — OB RESULTS CONSOLE GC/CHLAMYDIA: Gonorrhea: NEGATIVE

## 2021-06-06 LAB — OB RESULTS CONSOLE RPR: RPR: NONREACTIVE

## 2021-06-06 LAB — OB RESULTS CONSOLE HEPATITIS B SURFACE ANTIGEN: Hepatitis B Surface Ag: NEGATIVE

## 2021-06-06 LAB — OB RESULTS CONSOLE RUBELLA ANTIBODY, IGM: Rubella: IMMUNE

## 2021-06-06 LAB — OB RESULTS CONSOLE HIV ANTIBODY (ROUTINE TESTING): HIV: NONREACTIVE

## 2021-06-14 ENCOUNTER — Encounter: Payer: Self-pay | Admitting: *Deleted

## 2021-09-30 LAB — OB RESULTS CONSOLE GBS: GBS: POSITIVE

## 2021-10-02 ENCOUNTER — Other Ambulatory Visit: Payer: Self-pay

## 2021-10-02 ENCOUNTER — Encounter (HOSPITAL_COMMUNITY): Payer: Self-pay | Admitting: Obstetrics & Gynecology

## 2021-10-02 ENCOUNTER — Inpatient Hospital Stay (HOSPITAL_COMMUNITY)
Admission: AD | Admit: 2021-10-02 | Discharge: 2021-10-03 | Disposition: A | Discharge status: Home / Self Care | Payer: Medicaid Other | Attending: Obstetrics & Gynecology | Admitting: Obstetrics & Gynecology

## 2021-10-02 DIAGNOSIS — O479 False labor, unspecified: Secondary | ICD-10-CM

## 2021-10-02 DIAGNOSIS — O99891 Other specified diseases and conditions complicating pregnancy: Secondary | ICD-10-CM

## 2021-10-02 DIAGNOSIS — Z3A40 40 weeks gestation of pregnancy: Secondary | ICD-10-CM | POA: Insufficient documentation

## 2021-10-02 DIAGNOSIS — R200 Anesthesia of skin: Secondary | ICD-10-CM | POA: Diagnosis not present

## 2021-10-02 DIAGNOSIS — O471 False labor at or after 37 completed weeks of gestation: Secondary | ICD-10-CM | POA: Diagnosis present

## 2021-10-02 DIAGNOSIS — R519 Headache, unspecified: Secondary | ICD-10-CM | POA: Insufficient documentation

## 2021-10-02 DIAGNOSIS — O26893 Other specified pregnancy related conditions, third trimester: Secondary | ICD-10-CM | POA: Diagnosis not present

## 2021-10-02 DIAGNOSIS — R202 Paresthesia of skin: Secondary | ICD-10-CM

## 2021-10-02 MED ORDER — BUTALBITAL-APAP-CAFFEINE 50-325-40 MG PO TABS
2.0000 | ORAL_TABLET | Freq: Once | ORAL | Status: AC
  Administered 2021-10-02: 2 via ORAL
  Filled 2021-10-02: qty 2

## 2021-10-02 NOTE — MAU Provider Note (Signed)
Chief Complaint:  Contractions   Event Date/Time   First Provider Initiated Contact with Patient 10/02/21 2229     HPI: Vanessa Nunez is a 25 y.o. G1P0 at 75w2dwho presents to maternity admissions reporting uterine contractions. States when she has contraction, her hands and feet get numb, then gets better after contraction.   No weakness, loss of function or drooping.  Does have a headache since yesterday.  Took Tylenol yesterday with no relief, so she did not take any more. . She reports good fetal movement, denies LOF, vaginal bleeding, vaginal itching/burning, urinary symptoms, h/a, dizziness, n/v, diarrhea, constipation or fever/chills.    Headache  This is a new problem. The current episode started yesterday. The problem occurs constantly. The problem has been unchanged. The quality of the pain is described as aching. Pertinent negatives include no blurred vision, dizziness, fever, loss of balance, muscle aches, nausea, photophobia, sinus pressure, tingling, visual change, vomiting or weakness. Nothing aggravates the symptoms. She has tried acetaminophen for the symptoms. The treatment provided no relief.   RN Note: Ctxs all day today which have gotten closer and stronger. Denies VB or LOF. No recent sve. Good FM. When I have a contraction my arms and legs go numb and have had a headache last night. Took Tylenol but did not help  Past Medical History: Past Medical History:  Diagnosis Date   Asthma     Past obstetric history: OB History  Gravida Para Term Preterm AB Living  1            SAB IAB Ectopic Multiple Live Births               # Outcome Date GA Lbr Len/2nd Weight Sex Delivery Anes PTL Lv  1 Current             Past Surgical History: Past Surgical History:  Procedure Laterality Date   NO PAST SURGERIES      Family History: Family History  Problem Relation Age of Onset   Diabetes Father    Cancer Paternal Grandmother     Social History: Social  History   Tobacco Use   Smoking status: Never   Smokeless tobacco: Never  Vaping Use   Vaping Use: Never used  Substance Use Topics   Alcohol use: No   Drug use: No    Allergies: No Known Allergies  Meds:  Medications Prior to Admission  Medication Sig Dispense Refill Last Dose   acetaminophen (TYLENOL) 325 MG tablet Take 325 mg by mouth every 6 (six) hours as needed.   10/01/2021   albuterol (VENTOLIN HFA) 108 (90 Base) MCG/ACT inhaler Inhale 2 puffs into the lungs every 6 (six) hours as needed for wheezing or shortness of breath. 1 Inhaler 0 Past Month   Prenatal Vit-Fe Fumarate-FA (MULTIVITAMIN-PRENATAL) 27-0.8 MG TABS tablet Take 1 tablet by mouth daily at 12 noon.   10/01/2021   metroNIDAZOLE (METROGEL VAGINAL) 0.75 % vaginal gel Place 1 Applicatorful vaginally at bedtime. Insert one applicator, at bedtime, for 5 nights. 70 g 0     I have reviewed patient's Past Medical Hx, Surgical Hx, Family Hx, Social Hx, medications and allergies.   ROS:  Review of Systems  Constitutional:  Negative for fever.  HENT:  Negative for sinus pressure.   Eyes:  Negative for blurred vision and photophobia.  Gastrointestinal:  Negative for nausea and vomiting.  Neurological:  Positive for headaches. Negative for dizziness, tingling, weakness and loss of balance.  Other systems negative  Physical Exam  Patient Vitals for the past 24 hrs:  BP Temp Temp src Pulse Resp SpO2 Height Weight  10/02/21 2159 122/70 98.4 F (36.9 C) Oral (!) 105 -- 100 % -- --  10/02/21 2145 131/69 -- -- -- -- -- -- --  10/02/21 2144 -- 98.6 F (37 C) -- (!) 109 17 100 % 5' 2.5" (1.588 m) 118.4 kg   Constitutional: Well-developed, well-nourished female in no acute distress.  Cardiovascular: normal rate and rhythm Respiratory: normal effort GI: Abd soft, non-tender, gravid appropriate for gestational age.   No rebound or guarding. MS: Extremities nontender, no edema, normal ROM Neurologic: Alert and oriented x 4.  Bilateral hand grips normal and equal.  Bilateral foot dorsiflexion and plantar flexion strong and equal   No  facial drooping. No speech impediment.  Subjective hand and foot numbness (superficial) GU: Neg CVAT.  PELVIC EXAM:  Dilation: 1.5 Effacement (%): 50 Cervical Position: Middle Station: Ballotable Presentation: Vertex Exam by:: Hilda Lias CNM  FHT:  Baseline 130 , moderate variability, accelerations present, no decelerations Contractions:  Rare   Labs: No results found for this or any previous visit (from the past 24 hour(s)). s --/--/O POS (03/14 0249)  Imaging:  No results found.  MAU Course/MDM: I have ordered Fioricet for headache which totally relieved her headache. States numbness has improved also.  NST reviewed, reactive with no contractions.  Treatments in MAU included EFM, Fioricet.    Assessment: Single IUP at [redacted]w[redacted]d Headache, relieved Hand and feet numbness, improved No evidence of labor now  Plan: Discharge home Labor precautions and fetal kick counts Rx Fioricet prn headache Follow up in Office for prenatal visits  Encouraged to return if she develops worsening of symptoms, increase in pain, fever, or other concerning symptoms.   Pt stable at time of discharge.  Wynelle Bourgeois CNM, MSN Certified Nurse-Midwife 10/02/2021 10:29 PM

## 2021-10-02 NOTE — MAU Note (Signed)
Received 25 y/o, G1P0 40 2/7 weeks, pt c/o ctx since 730 pm today, rates pain 7/10 scale, c/o numbness to arm and legs with contractions that also started today, c/o headache 8/10 that started since yesterday, pt took tylenol 325mg  but got no relief, denies any dizziness, lightheadedness or right upper quadrant pain, denies any vaginal bleeding or leakage of fluid, + fetal Movement, safety maintained.

## 2021-10-02 NOTE — MAU Note (Addendum)
Ctxs all day today which have gotten closer and stronger. Denies VB or LOF. No recent sve. Good FM. When I have a contraction my arms and legs go numb and have had a headache last night. Took Tylenol but did not help

## 2021-10-03 MED ORDER — BUTALBITAL-APAP-CAFFEINE 50-325-40 MG PO TABS: 1.0000 | ORAL_TABLET | Freq: Four times a day (QID) | ORAL | 0 refills | Status: DC | PRN

## 2021-10-14 ENCOUNTER — Other Ambulatory Visit: Payer: Self-pay

## 2021-10-14 ENCOUNTER — Inpatient Hospital Stay (HOSPITAL_COMMUNITY): Payer: Medicaid Other | Admitting: Anesthesiology

## 2021-10-14 ENCOUNTER — Encounter (HOSPITAL_COMMUNITY): Payer: Self-pay | Admitting: Family Medicine

## 2021-10-14 ENCOUNTER — Inpatient Hospital Stay (HOSPITAL_COMMUNITY)
Admission: AD | Admit: 2021-10-14 | Discharge: 2021-10-16 | DRG: 807 | Disposition: A | Discharge status: Home / Self Care | Payer: Medicaid Other | Attending: Family Medicine | Admitting: Family Medicine

## 2021-10-14 DIAGNOSIS — O9952 Diseases of the respiratory system complicating childbirth: Secondary | ICD-10-CM | POA: Diagnosis present

## 2021-10-14 DIAGNOSIS — O99824 Streptococcus B carrier state complicating childbirth: Principal | ICD-10-CM | POA: Diagnosis present

## 2021-10-14 DIAGNOSIS — J45909 Unspecified asthma, uncomplicated: Secondary | ICD-10-CM | POA: Diagnosis present

## 2021-10-14 DIAGNOSIS — Z3A38 38 weeks gestation of pregnancy: Secondary | ICD-10-CM

## 2021-10-14 DIAGNOSIS — O99214 Obesity complicating childbirth: Secondary | ICD-10-CM | POA: Diagnosis present

## 2021-10-14 DIAGNOSIS — O26893 Other specified pregnancy related conditions, third trimester: Secondary | ICD-10-CM | POA: Diagnosis present

## 2021-10-14 DIAGNOSIS — Z20822 Contact with and (suspected) exposure to covid-19: Secondary | ICD-10-CM | POA: Diagnosis present

## 2021-10-14 LAB — CBC
HCT: 36.8 % (ref 36.0–46.0)
Hemoglobin: 12.1 g/dL (ref 12.0–15.0)
MCH: 27.6 pg (ref 26.0–34.0)
MCHC: 32.9 g/dL (ref 30.0–36.0)
MCV: 83.8 fL (ref 80.0–100.0)
Platelets: 337 10*3/uL (ref 150–400)
RBC: 4.39 MIL/uL (ref 3.87–5.11)
RDW: 13 % (ref 11.5–15.5)
WBC: 14.9 10*3/uL — ABNORMAL HIGH (ref 4.0–10.5)
nRBC: 0 % (ref 0.0–0.2)

## 2021-10-14 LAB — TYPE AND SCREEN
ABO/RH(D): O POS
Antibody Screen: NEGATIVE

## 2021-10-14 LAB — RESP PANEL BY RT-PCR (FLU A&B, COVID) ARPGX2
Influenza A by PCR: NEGATIVE
Influenza B by PCR: NEGATIVE
SARS Coronavirus 2 by RT PCR: NEGATIVE

## 2021-10-14 LAB — POCT FERN TEST: POCT Fern Test: POSITIVE

## 2021-10-14 MED ORDER — OXYTOCIN-SODIUM CHLORIDE 30-0.9 UT/500ML-% IV SOLN
2.5000 [IU]/h | INTRAVENOUS | Status: DC
  Filled 2021-10-14: qty 500

## 2021-10-14 MED ORDER — LACTATED RINGERS IV SOLN: INTRAVENOUS | Status: DC

## 2021-10-14 MED ORDER — SODIUM CHLORIDE 0.9 % IV SOLN
5.0000 10*6.[IU] | Freq: Once | INTRAVENOUS | Status: AC
  Administered 2021-10-14: 5 10*6.[IU] via INTRAVENOUS
  Filled 2021-10-14: qty 5

## 2021-10-14 MED ORDER — LACTATED RINGERS IV SOLN: 500.0000 mL | INTRAVENOUS | Status: DC | PRN

## 2021-10-14 MED ORDER — OXYTOCIN-SODIUM CHLORIDE 30-0.9 UT/500ML-% IV SOLN
1.0000 m[IU]/min | INTRAVENOUS | Status: DC
Start: 2021-10-14 — End: 2021-10-15
  Administered 2021-10-14: 4 m[IU]/min via INTRAVENOUS
  Administered 2021-10-14: 2 m[IU]/min via INTRAVENOUS

## 2021-10-14 MED ORDER — PHENYLEPHRINE 40 MCG/ML (10ML) SYRINGE FOR IV PUSH (FOR BLOOD PRESSURE SUPPORT)
80.0000 ug | PREFILLED_SYRINGE | INTRAVENOUS | Status: DC | PRN
Start: 2021-10-14 — End: 2021-10-15
  Filled 2021-10-14: qty 10

## 2021-10-14 MED ORDER — DIPHENHYDRAMINE HCL 50 MG/ML IJ SOLN: 12.5000 mg | INTRAMUSCULAR | Status: DC | PRN

## 2021-10-14 MED ORDER — FENTANYL-BUPIVACAINE-NACL 0.5-0.125-0.9 MG/250ML-% EP SOLN
12.0000 mL/h | EPIDURAL | Status: DC | PRN
  Administered 2021-10-14 – 2021-10-15 (×2): 12 mL/h via EPIDURAL
  Filled 2021-10-14 (×2): qty 250

## 2021-10-14 MED ORDER — PENICILLIN G POT IN DEXTROSE 60000 UNIT/ML IV SOLN
3.0000 10*6.[IU] | INTRAVENOUS | Status: DC
  Administered 2021-10-14 – 2021-10-15 (×4): 3 10*6.[IU] via INTRAVENOUS
  Filled 2021-10-14 (×7): qty 50

## 2021-10-14 MED ORDER — ONDANSETRON HCL 4 MG/2ML IJ SOLN
4.0000 mg | Freq: Four times a day (QID) | INTRAMUSCULAR | Status: DC | PRN
  Administered 2021-10-14: 4 mg via INTRAVENOUS
  Filled 2021-10-14: qty 2

## 2021-10-14 MED ORDER — OXYTOCIN BOLUS FROM INFUSION
333.0000 mL | Freq: Once | INTRAVENOUS | Status: AC
  Administered 2021-10-15: 333 mL via INTRAVENOUS

## 2021-10-14 MED ORDER — OXYCODONE-ACETAMINOPHEN 5-325 MG PO TABS: 2.0000 | ORAL_TABLET | ORAL | Status: DC | PRN

## 2021-10-14 MED ORDER — LIDOCAINE-EPINEPHRINE (PF) 2 %-1:200000 IJ SOLN
INTRAMUSCULAR | Status: DC | PRN
  Administered 2021-10-14: 5 mL via EPIDURAL

## 2021-10-14 MED ORDER — EPHEDRINE 5 MG/ML INJ: 10.0000 mg | INTRAVENOUS | Status: DC | PRN

## 2021-10-14 MED ORDER — OXYCODONE-ACETAMINOPHEN 5-325 MG PO TABS
1.0000 | ORAL_TABLET | ORAL | Status: DC | PRN
Start: 2021-10-14 — End: 2021-10-15

## 2021-10-14 MED ORDER — PHENYLEPHRINE 40 MCG/ML (10ML) SYRINGE FOR IV PUSH (FOR BLOOD PRESSURE SUPPORT): 80.0000 ug | PREFILLED_SYRINGE | INTRAVENOUS | Status: DC | PRN

## 2021-10-14 MED ORDER — LIDOCAINE HCL (PF) 1 % IJ SOLN: 30.0000 mL | INTRAMUSCULAR | Status: DC | PRN

## 2021-10-14 MED ORDER — LACTATED RINGERS IV SOLN
500.0000 mL | Freq: Once | INTRAVENOUS | Status: AC
  Administered 2021-10-14: 500 mL via INTRAVENOUS

## 2021-10-14 MED ORDER — SOD CITRATE-CITRIC ACID 500-334 MG/5ML PO SOLN: 30.0000 mL | ORAL | Status: DC | PRN

## 2021-10-14 MED ORDER — FENTANYL CITRATE (PF) 100 MCG/2ML IJ SOLN
100.0000 ug | INTRAMUSCULAR | Status: DC | PRN
  Administered 2021-10-14 (×2): 100 ug via INTRAVENOUS
  Filled 2021-10-14 (×2): qty 2

## 2021-10-14 MED ORDER — TERBUTALINE SULFATE 1 MG/ML IJ SOLN: 0.2500 mg | Freq: Once | INTRAMUSCULAR | Status: DC | PRN

## 2021-10-14 MED ORDER — ACETAMINOPHEN 325 MG PO TABS: 650.0000 mg | ORAL_TABLET | ORAL | Status: DC | PRN

## 2021-10-14 NOTE — Anesthesia Preprocedure Evaluation (Signed)
Anesthesia Evaluation  Patient identified by MRN, date of birth, ID band Patient awake    Reviewed: Allergy & Precautions, NPO status , Patient's Chart, lab work & pertinent test results  Airway Mallampati: III  TM Distance: >3 FB Neck ROM: Full    Dental no notable dental hx.    Pulmonary asthma ,    Pulmonary exam normal breath sounds clear to auscultation       Cardiovascular negative cardio ROS Normal cardiovascular exam Rhythm:Regular Rate:Normal     Neuro/Psych negative neurological ROS  negative psych ROS   GI/Hepatic negative GI ROS, Neg liver ROS,   Endo/Other  Morbid obesity (BMI 48)  Renal/GU negative Renal ROS  negative genitourinary   Musculoskeletal negative musculoskeletal ROS (+)   Abdominal   Peds  Hematology negative hematology ROS (+)   Anesthesia Other Findings Presenting with SROM  Reproductive/Obstetrics (+) Pregnancy                             Anesthesia Physical Anesthesia Plan  ASA: 3  Anesthesia Plan: Epidural   Post-op Pain Management:    Induction:   PONV Risk Score and Plan: Treatment may vary due to age or medical condition  Airway Management Planned: Natural Airway  Additional Equipment:   Intra-op Plan:   Post-operative Plan:   Informed Consent: I have reviewed the patients History and Physical, chart, labs and discussed the procedure including the risks, benefits and alternatives for the proposed anesthesia with the patient or authorized representative who has indicated his/her understanding and acceptance.       Plan Discussed with: Anesthesiologist  Anesthesia Plan Comments: (Patient identified. Risks, benefits, options discussed with patient including but not limited to bleeding, infection, nerve damage, paralysis, failed block, incomplete pain control, headache, blood pressure changes, nausea, vomiting, reactions to medication,  itching, and post partum back pain. Confirmed with bedside nurse the patient's most recent platelet count. Confirmed with the patient that they are not taking any anticoagulation, have any bleeding history or any family history of bleeding disorders. Patient expressed understanding and wishes to proceed. All questions were answered. )        Anesthesia Quick Evaluation

## 2021-10-14 NOTE — MAU Provider Note (Signed)
Pt informed that the ultrasound is considered a limited OB ultrasound and is not intended to be a complete ultrasound exam.  Patient also informed that the ultrasound is not being completed with the intent of assessing for fetal or placental anomalies or any pelvic abnormalities.  Explained that the purpose of today's ultrasound is to assess for  presentation.  Patient acknowledges the purpose of the exam and the limitations of the study.    Baby is vertex.  Levie Heritage, DO 10/14/2021 10:41 AM

## 2021-10-14 NOTE — Progress Notes (Addendum)
Vanessa Nunez, CNM @ bedside discussing POC.  Plan is to insert intracervical catheter to augment labor.  Pt verbalized understanding and agreeable with POC.

## 2021-10-14 NOTE — MAU Note (Signed)
Presents stating her water broke @ 0845 this morning, reports fluid is clear.  Reports began having ctxs after water broke, reports ctxs are 4-5 minutes apart.  Endorses +FM.

## 2021-10-14 NOTE — Progress Notes (Signed)
Pt is comfortable with epidural but tired.   FHT 140, mod var +accels, no decels Toco Q2 min CE 7-8/70/-1, no molding  After discussing IUPC with pt, she consents to placement. IUPC placed easily without any resistance.   Plan: Continue pitocin but titrate according to MVUs. FHT category 1. GBS pos getting PCN. Anticipate SVD.

## 2021-10-14 NOTE — H&P (Signed)
OBSTETRIC ADMISSION HISTORY AND PHYSICAL  Vanessa Nunez is a 25 y.o. female G1P0 with IUP at [redacted]w[redacted]d by LMP presenting for SROM around 0830., is having mild ctx about q 6 minutes. She reports +FMs and LOF, no VB, no blurry vision, headaches or peripheral edema, and RUQ pain.  She plans on breast feeding and bottle feeding. She requests POPs for birth control. She [facility]   Dating: By LMP --->  Estimated Date of Delivery: 10/26/21 Sono:  @[redacted]w[redacted]d , normal anatomy, posterior placenta, 26% EFW  Prenatal History/Complications: GBS+  Past Medical History: Past Medical History:  Diagnosis Date   Asthma     Past Surgical History: Past Surgical History:  Procedure Laterality Date   NO PAST SURGERIES      Obstetrical History: OB History     Gravida  1   Para      Term      Preterm      AB      Living         SAB      IAB      Ectopic      Multiple      Live Births              Social History Social History   Socioeconomic History   Marital status: Single    Spouse name: Not on file   Number of children: Not on file   Years of education: Not on file   Highest education level: Not on file  Occupational History   Not on file  Tobacco Use   Smoking status: Never   Smokeless tobacco: Never  Vaping Use   Vaping Use: Never used  Substance and Sexual Activity   Alcohol use: No   Drug use: No   Sexual activity: Not Currently  Other Topics Concern   Not on file  Social History Narrative   Not on file   Social Determinants of Health   Financial Resource Strain: Not on file  Food Insecurity: Not on file  Transportation Needs: Not on file  Physical Activity: Not on file  Stress: Not on file  Social Connections: Not on file    Family History: Family History  Problem Relation Age of Onset   Diabetes Father    Cancer Paternal Grandmother     Allergies: Allergies  Allergen Reactions   Peach Flavor      Medications Prior to Admission  Medication Sig Dispense Refill Last Dose   albuterol (VENTOLIN HFA) 108 (90 Base) MCG/ACT inhaler Inhale 2 puffs into the lungs every 6 (six) hours as needed for wheezing or shortness of breath. 1 Inhaler 0 Past Month   Prenatal Vit-Fe Fumarate-FA (MULTIVITAMIN-PRENATAL) 27-0.8 MG TABS tablet Take 1 tablet by mouth daily at 12 noon.   10/13/2021   acetaminophen (TYLENOL) 325 MG tablet Take 325 mg by mouth every 6 (six) hours as needed.      butalbital-acetaminophen-caffeine (FIORICET) 50-325-40 MG tablet Take 1-2 tablets by mouth every 6 (six) hours as needed for headache. 20 tablet 0    metroNIDAZOLE (METROGEL VAGINAL) 0.75 % vaginal gel Place 1 Applicatorful vaginally at bedtime. Insert one applicator, at bedtime, for 5 nights. 70 g 0     Review of Systems  All systems reviewed and negative except as stated in HPI  Blood pressure 131/74, pulse 90, temperature 98.7 F (37.1 C), temperature source Oral, resp. rate 20, height 5\' 2"  (1.575 m), weight 117.9 kg, last menstrual period  12/24/2020, SpO2 100 %. General appearance: alert, cooperative, and no distress Lungs: clear to auscultation bilaterally Heart: regular rate and rhythm Abdomen: soft, non-tender; bowel sounds normal Pelvic: 1.5/50/-2 Extremities: Homans sign is negative, no sign of DVT DTR's 2+ Presentation: cephalic Fetal monitoring: Baseline: 150 bpm, Variability: Good {> 6 bpm), Accelerations: Reactive, and Decelerations: Absent Uterine activity: q 5-7 minutes Dilation: 1.5 Presentation: Vertex Exam by:: Harvin Hazel, CNM   Prenatal labs: ABO, Rh: --/--/O POS (10/31 1015) O+ Antibody: NEG (10/31 1015) negative Rubella: Immune (06/23 0000)immune RPR: Nonreactive (06/23 0000) nonreactive HBsAg: Negative (06/23 0000) nonreactive HIV: Non-reactive (06/23 0000)  GBS: Positive/-- (10/17 0000) positive GTT @28 -30w: 1hr 133, 2hr 110, 3hr 99 Genetic screening: none Anatomy :  normal female  Prenatal Transfer Tool  Maternal Diabetes: No Genetic Screening: none Maternal Ultrasounds/Referrals: Normal Fetal Ultrasounds or other Referrals:  None Maternal Substance Abuse:  No Significant Maternal Medications:  None Significant Maternal Lab Results: Group B Strep positive  Results for orders placed or performed during the hospital encounter of 10/14/21 (from the past 24 hour(s))  POCT fern test   Collection Time: 10/14/21 10:11 AM  Result Value Ref Range   POCT Fern Test Positive = ruptured amniotic membanes   Type and screen [facility]   Collection Time: 10/14/21 10:15 AM  Result Value Ref Range   ABO/RH(D) O POS    Antibody Screen NEG    Sample Expiration      10/17/2021,2359 Performed at [facility], [address] [place], [place]   Resp Panel by RT-PCR (Flu A&B, Covid) Nasopharyngeal Swab   Collection Time: 10/14/21 10:20 AM   Specimen: Nasopharyngeal Swab; Nasopharyngeal(NP) swabs in vial transport medium  Result Value Ref Range   SARS Coronavirus 2 by RT PCR NEGATIVE NEGATIVE   Influenza A by PCR NEGATIVE NEGATIVE   Influenza B by PCR NEGATIVE NEGATIVE  CBC   Collection Time: 10/14/21 10:20 AM  Result Value Ref Range   WBC 14.9 (H) 4.0 - 10.5 K/uL   RBC 4.39 3.87 - 5.11 MIL/uL   Hemoglobin 12.1 12.0 - 15.0 g/dL   HCT 10/16/21 16.8 - 37.2 %   MCV 83.8 80.0 - 100.0 fL   MCH 27.6 26.0 - 34.0 pg   MCHC 32.9 30.0 - 36.0 g/dL   RDW 90.2 11.1 - 55.2 %   Platelets 337 150 - 400 K/uL   nRBC 0.0 0.0 - 0.2 %    Patient Active Problem List   Diagnosis Date Noted   Indication for care in labor and delivery, antepartum 10/14/2021     Assessment/Plan:  Vanessa Nunez is a 25 y.o. G1P0 at 107w2d here for SROM.  #Labor: early labor/ROM.  Cooks catheter inserted and inflated w/80cc H20 #Pain: plans epidural #Fetal Well Being: Cat 1 #ID: GBS +, PCN #Method Of Feeding: breast feeding and bottle  feeding #Method Of Contraception: oral progesterone-only contraceptive  #Circ: N/A  [redacted]w[redacted]d, CNM 10/14/2021, 2:15 PM

## 2021-10-14 NOTE — Anesthesia Procedure Notes (Signed)
Epidural Patient location during procedure: OB Start time: 10/14/2021 2:45 PM End time: 10/14/2021 2:55 PM  Staffing Anesthesiologist: Elmer Picker, MD Performed: anesthesiologist   Preanesthetic Checklist Completed: patient identified, IV checked, risks and benefits discussed, monitors and equipment checked, pre-op evaluation and timeout performed  Epidural Patient position: sitting Prep: DuraPrep and site prepped and draped Patient monitoring: continuous pulse ox, blood pressure, heart rate and cardiac monitor Approach: midline Location: L3-L4 Injection technique: LOR air  Needle:  Needle type: Tuohy  Needle gauge: 17 G Needle length: 9 cm Needle insertion depth: 7 cm Catheter type: closed end flexible Catheter size: 19 Gauge Catheter at skin depth: 13 cm Test dose: negative  Assessment Sensory level: T8 Events: blood not aspirated, injection not painful, no injection resistance, no paresthesia and negative IV test  Additional Notes Patient identified. Risks/Benefits/Options discussed with patient including but not limited to bleeding, infection, nerve damage, paralysis, failed block, incomplete pain control, headache, blood pressure changes, nausea, vomiting, reactions to medication both or allergic, itching and postpartum back pain. Confirmed with bedside nurse the patient's most recent platelet count. Confirmed with patient that they are not currently taking any anticoagulation, have any bleeding history or any family history of bleeding disorders. Patient expressed understanding and wished to proceed. All questions were answered. Sterile technique was used throughout the entire procedure. Please see nursing notes for vital signs. Test dose was given through epidural catheter and negative prior to continuing to dose epidural or start infusion. Warning signs of high block given to the patient including shortness of breath, tingling/numbness in hands, complete motor block,  or any concerning symptoms with instructions to call for help. Patient was given instructions on fall risk and not to get out of bed. All questions and concerns addressed with instructions to call with any issues or inadequate analgesia.  Reason for block:procedure for pain

## 2021-10-15 ENCOUNTER — Encounter (HOSPITAL_COMMUNITY): Payer: Self-pay | Admitting: Family Medicine

## 2021-10-15 DIAGNOSIS — O99824 Streptococcus B carrier state complicating childbirth: Secondary | ICD-10-CM

## 2021-10-15 DIAGNOSIS — Z3A38 38 weeks gestation of pregnancy: Secondary | ICD-10-CM

## 2021-10-15 LAB — RPR: RPR Ser Ql: NONREACTIVE

## 2021-10-15 MED ORDER — FERROUS SULFATE 325 (65 FE) MG PO TABS
325.0000 mg | ORAL_TABLET | ORAL | Status: DC
  Administered 2021-10-15: 325 mg via ORAL
  Filled 2021-10-15: qty 1

## 2021-10-15 MED ORDER — MEASLES, MUMPS & RUBELLA VAC IJ SOLR: 0.5000 mL | Freq: Once | INTRAMUSCULAR | Status: DC

## 2021-10-15 MED ORDER — METHYLERGONOVINE MALEATE 0.2 MG PO TABS: 0.2000 mg | ORAL_TABLET | ORAL | Status: DC | PRN

## 2021-10-15 MED ORDER — ONDANSETRON HCL 4 MG/2ML IJ SOLN: 4.0000 mg | INTRAMUSCULAR | Status: DC | PRN

## 2021-10-15 MED ORDER — IBUPROFEN 600 MG PO TABS
600.0000 mg | ORAL_TABLET | Freq: Four times a day (QID) | ORAL | Status: DC
  Administered 2021-10-15 – 2021-10-16 (×5): 600 mg via ORAL
  Filled 2021-10-15 (×5): qty 1

## 2021-10-15 MED ORDER — COCONUT OIL OIL: 1.0000 "application " | TOPICAL_OIL | Status: DC | PRN

## 2021-10-15 MED ORDER — METHYLERGONOVINE MALEATE 0.2 MG/ML IJ SOLN: 0.2000 mg | INTRAMUSCULAR | Status: DC | PRN

## 2021-10-15 MED ORDER — TETANUS-DIPHTH-ACELL PERTUSSIS 5-2.5-18.5 LF-MCG/0.5 IM SUSY
0.5000 mL | PREFILLED_SYRINGE | Freq: Once | INTRAMUSCULAR | Status: DC
Start: 2021-10-16 — End: 2021-10-16

## 2021-10-15 MED ORDER — MEDROXYPROGESTERONE ACETATE 150 MG/ML IM SUSP: 150.0000 mg | INTRAMUSCULAR | Status: DC | PRN

## 2021-10-15 MED ORDER — BENZOCAINE-MENTHOL 20-0.5 % EX AERO
1.0000 "application " | INHALATION_SPRAY | CUTANEOUS | Status: DC | PRN
  Administered 2021-10-15: 1 via TOPICAL
  Filled 2021-10-15: qty 56

## 2021-10-15 MED ORDER — WITCH HAZEL-GLYCERIN EX PADS: 1.0000 "application " | MEDICATED_PAD | CUTANEOUS | Status: DC | PRN

## 2021-10-15 MED ORDER — ONDANSETRON HCL 4 MG PO TABS: 4.0000 mg | ORAL_TABLET | ORAL | Status: DC | PRN

## 2021-10-15 MED ORDER — PRENATAL MULTIVITAMIN CH
1.0000 | ORAL_TABLET | Freq: Every day | ORAL | Status: DC
  Administered 2021-10-15 – 2021-10-16 (×2): 1 via ORAL
  Filled 2021-10-15 (×2): qty 1

## 2021-10-15 MED ORDER — ACETAMINOPHEN 325 MG PO TABS
650.0000 mg | ORAL_TABLET | ORAL | Status: DC | PRN
  Administered 2021-10-15 – 2021-10-16 (×3): 650 mg via ORAL
  Filled 2021-10-15 (×3): qty 2

## 2021-10-15 MED ORDER — SIMETHICONE 80 MG PO CHEW: 80.0000 mg | CHEWABLE_TABLET | ORAL | Status: DC | PRN

## 2021-10-15 MED ORDER — DIPHENHYDRAMINE HCL 25 MG PO CAPS: 25.0000 mg | ORAL_CAPSULE | Freq: Four times a day (QID) | ORAL | Status: DC | PRN

## 2021-10-15 MED ORDER — DOCUSATE SODIUM 100 MG PO CAPS
100.0000 mg | ORAL_CAPSULE | Freq: Two times a day (BID) | ORAL | Status: DC
  Administered 2021-10-15 – 2021-10-16 (×2): 100 mg via ORAL
  Filled 2021-10-15 (×2): qty 1

## 2021-10-15 MED ORDER — DIBUCAINE (PERIANAL) 1 % EX OINT: 1.0000 "application " | TOPICAL_OINTMENT | CUTANEOUS | Status: DC | PRN

## 2021-10-15 MED ORDER — FLEET ENEMA 7-19 GM/118ML RE ENEM: 1.0000 | ENEMA | Freq: Every day | RECTAL | Status: DC | PRN

## 2021-10-15 MED ORDER — BISACODYL 10 MG RE SUPP: 10.0000 mg | Freq: Every day | RECTAL | Status: DC | PRN

## 2021-10-15 NOTE — Anesthesia Postprocedure Evaluation (Signed)
Anesthesia Post Note  Patient: Vanessa Nunez  Procedure(s) Performed: AN AD HOC LABOR EPIDURAL     Patient location during evaluation: Mother Baby Anesthesia Type: Epidural Level of consciousness: awake, oriented and awake and alert Pain management: pain level controlled Vital Signs Assessment: post-procedure vital signs reviewed and stable Respiratory status: spontaneous breathing, respiratory function stable and nonlabored ventilation Cardiovascular status: stable Postop Assessment: no headache, adequate PO intake, able to ambulate, patient able to bend at knees and no apparent nausea or vomiting Anesthetic complications: no   No notable events documented.  Last Vitals:  Vitals:   10/15/21 0945 10/15/21 1045  BP: (!) 111/46 111/64  Pulse: 96 (!) 101  Resp:    Temp: (!) 38.1 C 37 C  SpO2: 99% 98%    Last Pain:  Vitals:   10/15/21 1205  TempSrc:   PainSc: 6    Pain Goal:                   [name]

## 2021-10-15 NOTE — Lactation Note (Signed)
This note was copied from a baby's chart. Lactation Consultation Note  Patient Name: Girl Batul Diego [id number] Date: 10/15/2021 Reason for consult: Initial assessment;Early term 37-38.6wks;[place] Age:24 hours  Initial visit to 9 hours old infant of a P1 mother. Baby is skin to skin upon arrival. Mother states baby just took 6-mL of formula. Per mother, baby also took 10-mL of formula in L&D.  Mother reports some breast changes with pregnancy. LC demonstrated hand expression but mother expresses pain with technique. Discussed the importance of breast stimulation and expressing to establish a good milk supply.  LC talked about pacing, upright position and frequent burping when formula feeding and provided volume guidelines.  Reviewed normal newborn behavior during first 24h, expected output, tummy size and feeding frequency.    Feeding plan:  1-Feeding on demand or 8-12 times in 24h period  2-Follow volume guidelines when formula feeding and pacing.   3-Encouraged maternal rest, hydration and food intake.    All questions answered at this time. Provided Lactation services brochure and promoted [employer] booklet information.     Maternal Data Has patient been taught Hand Expression?: Yes Does the patient have breastfeeding experience prior to this delivery?: No  Feeding Mother's Current Feeding Choice: Breast Milk and Formula Nipple Type: Slow - flow  Interventions Interventions: Breast feeding basics reviewed;Skin to skin;Hand express;Breast massage;Education;Pace feeding;LC Services brochure  Discharge WIC Program: Yes  Consult Status Consult Status: Follow-up Date: 10/16/21 Follow-up type: In-patient    Tavarion Babington A Higuera Ancidey 10/15/2021, 5:40 PM

## 2021-10-15 NOTE — Discharge Summary (Signed)
Postpartum Discharge Summary  Date of Service updated11/2/22     Patient Name: Vanessa Nunez DOB: 1996/09/06 MRN: [id number]  Date of admission: 10/14/2021 Delivery date:10/15/2021  Delivering provider: Christin Fudge  Date of discharge: 10/16/2021  Admitting diagnosis: Indication for care in labor and delivery, antepartum [O75.9] Intrauterine pregnancy: [redacted]w[redacted]d     Secondary diagnosis:  Active Problems:   Indication for care in labor and delivery, antepartum  Additional problems: none    Discharge diagnosis: Term Pregnancy Delivered                                              Post partum procedures: none Augmentation: Pitocin and IP Foley Complications: None  Hospital course: Onset of Labor With Vaginal Delivery      25 y.o. yo G1P0 at [redacted]w[redacted]d was admitted in Latent Labor on 10/14/2021. Patient had an uncomplicated labor course as follows:  Membrane Rupture Time/Date: 8:45 AM ,10/14/2021   Delivery Method:Vaginal, Spontaneous  Episiotomy: None  Lacerations:  2nd degree  Patient had an uncomplicated postpartum course.  She is ambulating, tolerating a regular diet, passing flatus, and urinating well. Patient is discharged home in stable condition on 10/16/21.  Newborn Data: Birth date:10/15/2021  Birth time:8:01 AM  Gender:Female  Living status:Living  Apgars:9 ,9  Weight:2892 g   Magnesium Sulfate received: No BMZ received: No Rhophylac:N/A MMR:N/A Transfusion:No  Physical exam  Vitals:   10/15/21 1421 10/15/21 1800 10/15/21 2245 10/16/21 0523  BP: (!) 99/53 117/71 115/64 (!) 108/56  Pulse: 75 91 75 82  Resp:   16 18  Temp: 98.4 F (36.9 C)  98.2 F (36.8 C) 98.2 F (36.8 C)  TempSrc: Oral  Oral Oral  SpO2: 98% 100%    Weight:      Height:       General: alert, cooperative, and no distress Lochia: appropriate Uterine Fundus: firm Incision: N/A DVT Evaluation: No evidence of DVT seen on physical exam. Negative Homan's sign. No  cords or calf tenderness. No significant calf/ankle edema. Labs: Lab Results  Component Value Date   WBC 14.9 (H) 10/14/2021   HGB 12.1 10/14/2021   HCT 36.8 10/14/2021   MCV 83.8 10/14/2021   PLT 337 10/14/2021   No flowsheet data found. Edinburgh Score: Edinburgh Postnatal Depression Scale Screening Tool 10/15/2021  I have been able to laugh and see the funny side of things. 0  I have looked forward with enjoyment to things. 0  I have blamed myself unnecessarily when things went wrong. 0  I have been anxious or worried for no good reason. 0  I have felt scared or panicky for no good reason. 0  Things have been getting on top of me. 0  I have been so unhappy that I have had difficulty sleeping. 0  I have felt sad or miserable. 0  I have been so unhappy that I have been crying. 0  The thought of harming myself has occurred to me. 0  Edinburgh Postnatal Depression Scale Total 0     After visit meds:  Allergies as of 10/16/2021       Reactions   Peach Flavor         Medication List     STOP taking these medications    acetaminophen 325 MG tablet Commonly known as: TYLENOL   butalbital-acetaminophen-caffeine 50-325-40 MG tablet Commonly known as:  FIORICET   metroNIDAZOLE 0.75 % vaginal gel Commonly known as: METROGEL VAGINAL       TAKE these medications    albuterol 108 (90 Base) MCG/ACT inhaler Commonly known as: Ventolin HFA Inhale 2 puffs into the lungs every 6 (six) hours as needed for wheezing or shortness of breath.   multivitamin-prenatal 27-0.8 MG Tabs tablet Take 1 tablet by mouth daily at 12 noon.         Discharge home in stable condition Infant Feeding: Breast Infant Disposition:home with mother Discharge instruction: per After Visit Summary and Postpartum booklet. Activity: Advance as tolerated. Pelvic rest for 6 weeks.  Diet: routine diet Future Appointments:No future appointments. Follow up Visit:  Follow-up Information      Department, [facility]. Schedule an appointment as soon as possible for a visit.   Why: 4-6 weeks for your postpartum visit Contact information: [place] [address] [phone]                  Please schedule this patient for a Virtual postpartum visit in 4 weeks with the following provider: Any provider. Additional Postpartum F/U:  Low risk pregnancy complicated by:  Delivery mode:  Vaginal, Spontaneous  Anticipated Birth Control:  POPs   10/16/2021 Roma Schanz, CNM

## 2021-10-15 NOTE — Plan of Care (Signed)
Kierra Jezewski, RN 

## 2021-10-15 NOTE — Progress Notes (Signed)
Patient Vitals for the past 4 hrs:  BP Temp Temp src Pulse Resp  10/15/21 0600 104/61 -- -- 94 18  10/15/21 0530 112/68 -- -- 96 17  10/15/21 0500 111/68 99.3 F (37.4 C) Oral (!) 101 20  10/15/21 0430 115/74 -- -- 92 18  10/15/21 0400 119/65 -- -- (!) 110 17  10/15/21 0330 119/77 98.8 F (37.1 C) Oral (!) 108 19   Cx C/C/0 station at 0300,  No big urge to push, labored down until 0600.  Vtx now at +1-+2 station, pushing. FHR Cat 1 w/accels.

## 2021-10-16 ENCOUNTER — Ambulatory Visit: Payer: Self-pay

## 2021-10-16 NOTE — Lactation Note (Signed)
This note was copied from a baby's chart. Lactation Consultation Note  Patient Name: Vanessa Nunez [id number] Date: 10/16/2021 Reason for consult: Follow-up assessment;Primapara;1st time breastfeeding;Early term 37-38.6wks;Infant weight loss;Other (Comment) (1 % weight loss / baby has been receiving mostly bottles / per mom plans to work on the Breast feeding and as given a Hand pump/ and bottle feed. see LC note for D/C .) Age:24 hours LC reviewed with mom the best way to transition the baby to the breast .  Prior to latch/ Take Warm moist towel / apply it to the breast for 10 - 15 mins to enhance the flow ,  Prior to latch - breast massage, hand express, pre- pump, and give the baby and appetizer of EBM or formula ( 10 ml ) then latch with firm support.  Offer the 2nd breast if baby still hungry.  LC recommended an LC O/P appt and mom receptive.  LC placed in the Epic basket  request.  Mom has the [facility] brochure with resource numbers.  Maternal Data    Feeding Mother's Current Feeding Choice: Breast Milk and Formula Nipple Type: Slow - flow  LATCH Score                    Lactation Tools Discussed/Used Tools: Pump;Flanges Flange Size: 24;27 Breast pump type: Manual Pump Education: Milk Storage  Interventions Interventions: Breast feeding basics reviewed;LC Services brochure;Education;Hand pump  Discharge Discharge Education: Engorgement and breast care;Warning signs for feeding baby;Outpatient recommendation;Outpatient Epic message sent;Other (comment) (mom aware she will be called) Pump: Manual  Consult Status Consult Status: Complete Date: 10/16/21    Kathrin Greathouse 10/16/2021, 2:01 PM

## 2021-10-16 NOTE — Discharge Instructions (Signed)
NO SEX UNTIL AFTER YOU GET YOUR BIRTH CONTROL  

## 2021-10-30 ENCOUNTER — Telehealth (HOSPITAL_COMMUNITY): Payer: Self-pay | Admitting: *Deleted

## 2021-10-30 NOTE — Telephone Encounter (Signed)
Hospital Discharge Follow-Up Call:  Patient reports that she is well and has no concerns about her healing process.  EPDS today was 0 and she endorses this accurately reflects that she is doing well emotionally.  Patient says that baby is well and she has no concerns about baby's health.  She reports that baby sleeps in a bassinet.  Reviewed ABCs of Safe Sleep. 

## 2023-02-16 IMAGING — US US OB < 14 WEEKS - US OB TV
1 series · 15 of 28 positions shown · non-contrast
Comparison: None.

CLINICAL DATA: Pelvic pain, discharge

EXAM:
OBSTETRIC <14 WK US AND TRANSVAGINAL OB US
TECHNIQUE: Both transabdominal and transvaginal ultrasound examinations were
performed for complete evaluation of the gestation as well as the
maternal uterus, adnexal regions, and pelvic cul-de-sac.
Transvaginal technique was performed to assess early pregnancy.

[Series 1: us ob < 14 weeks - us ob tv · 34 acquisitions, 15 frames shown]
[im 1/34]
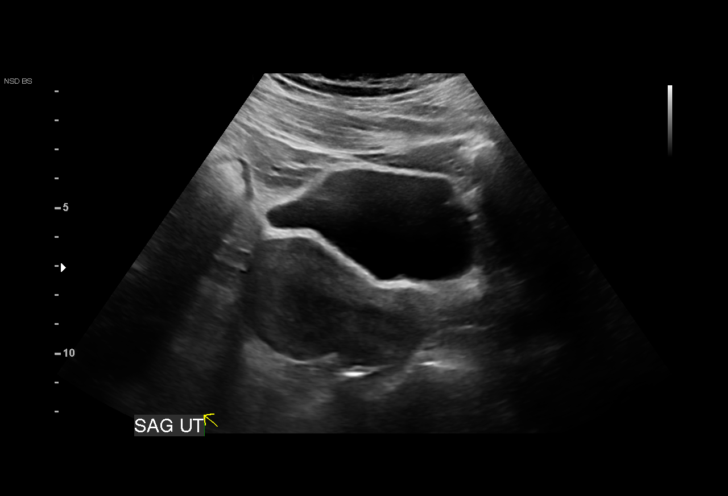
[im 3/34]
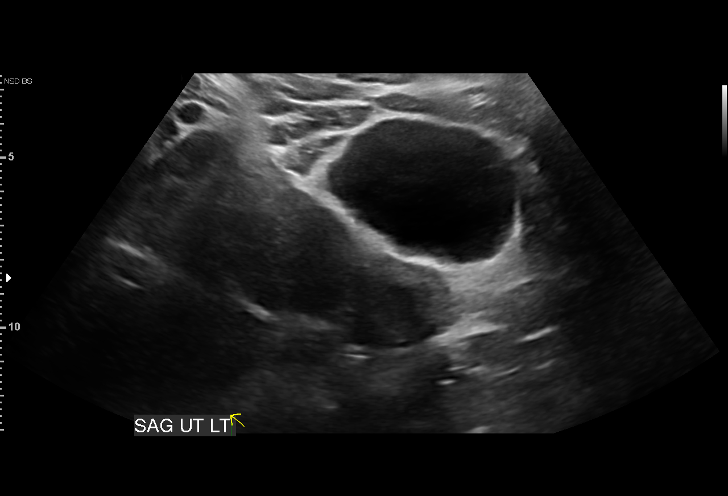
[im 5/34]
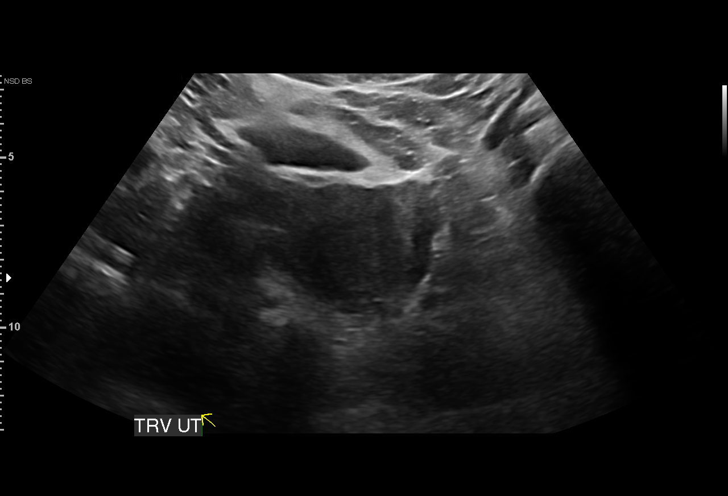
[im 8/34]
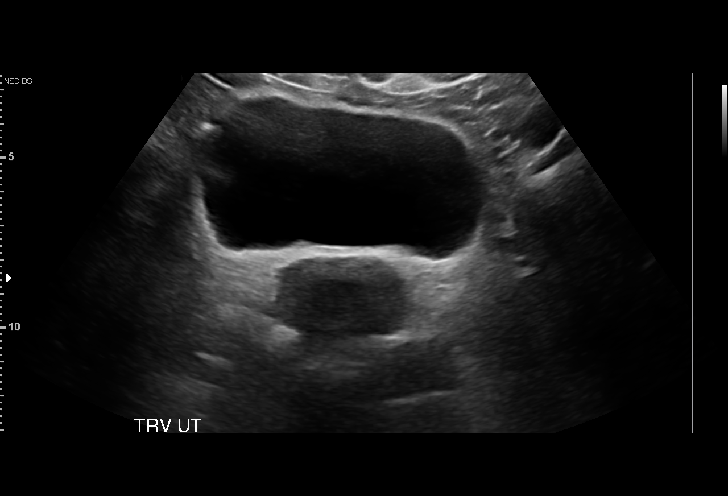
[im 10/34]
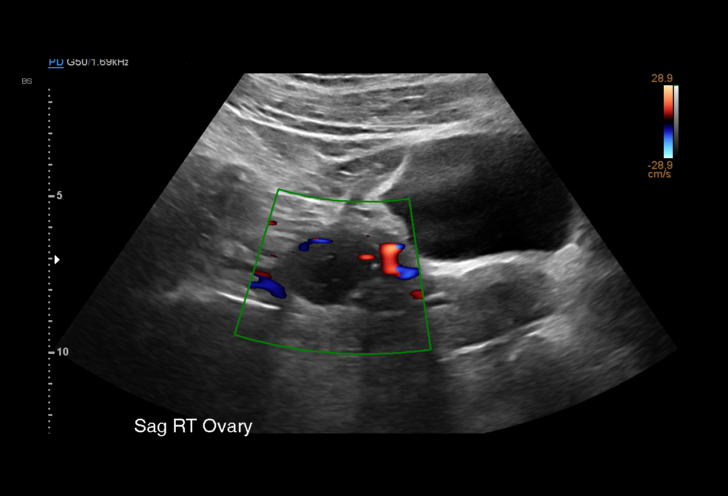
[im 13/34]
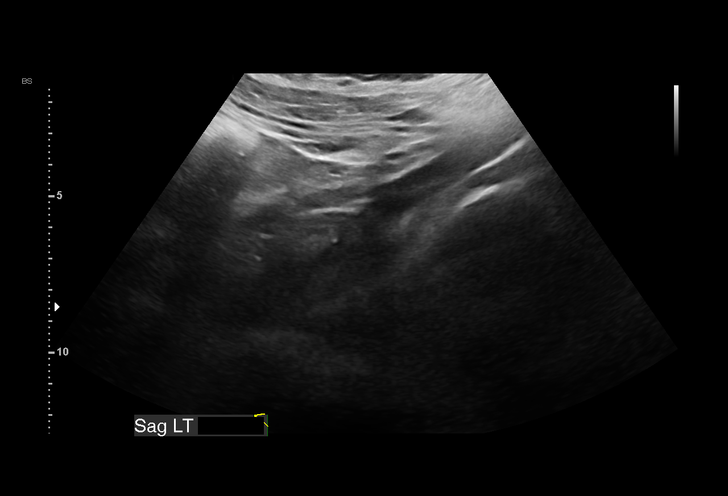
[im 15/34]
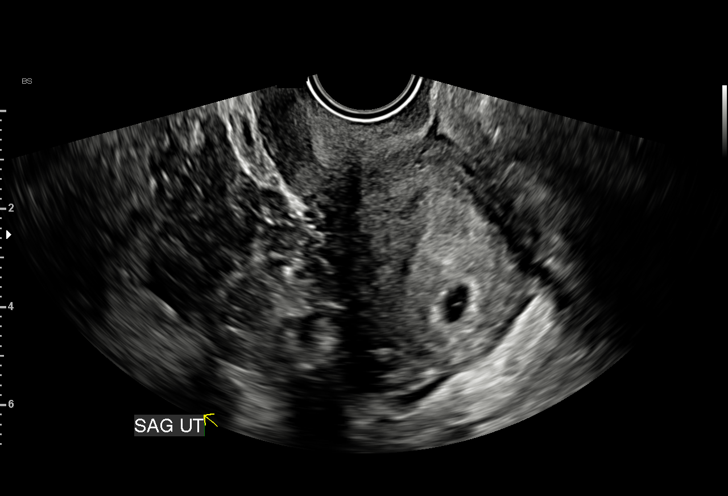
[im 18/34]
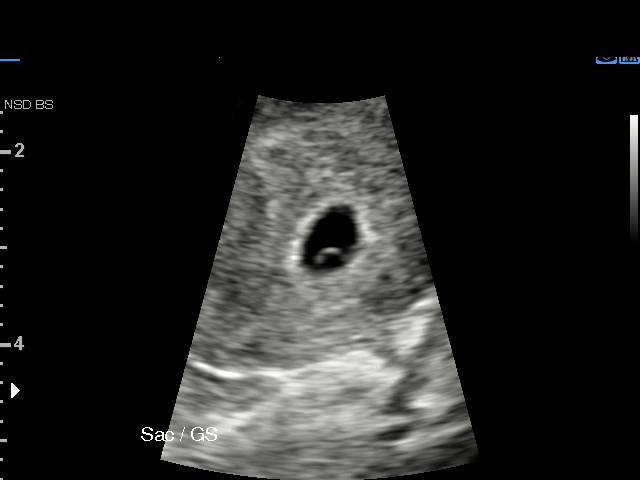
[im 19/34]
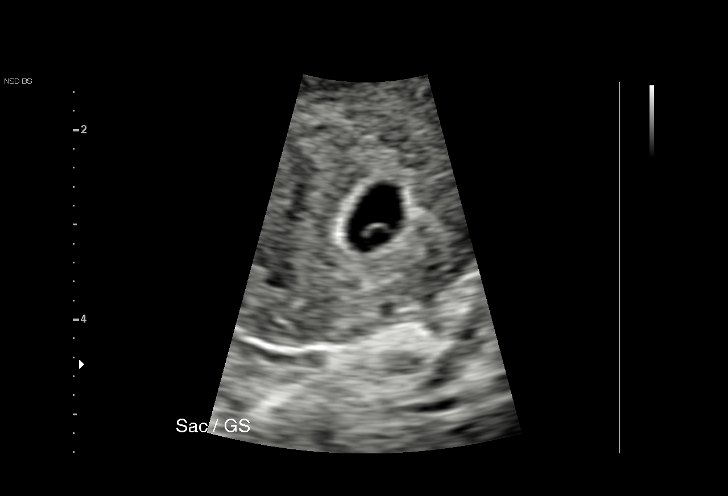
[im 21/34]
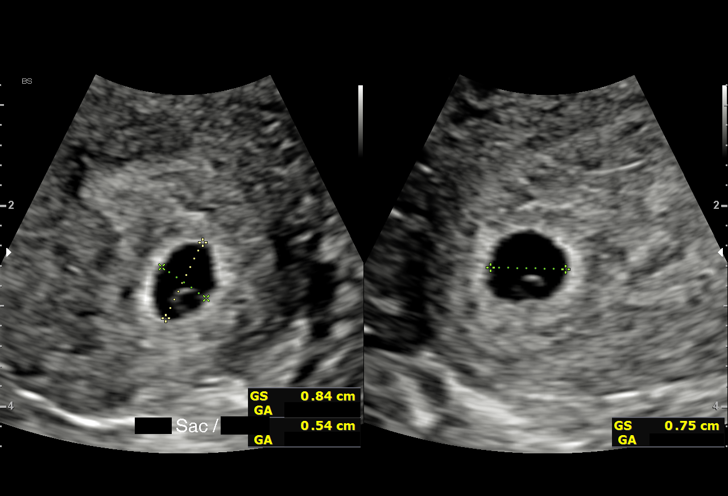
[im 24/34]
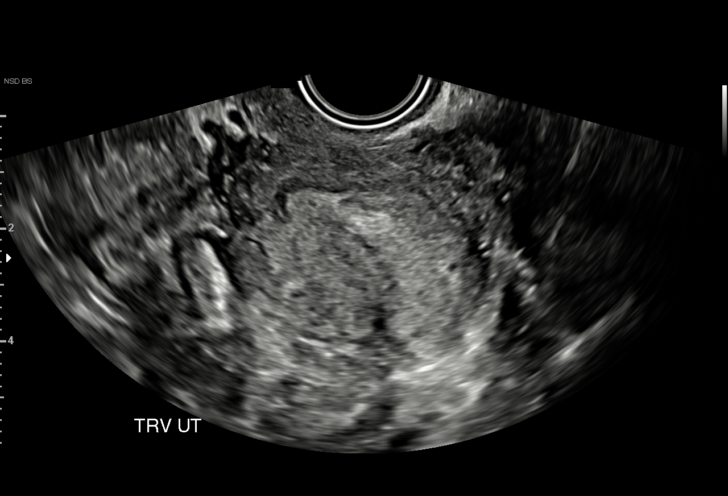
[im 26/34]
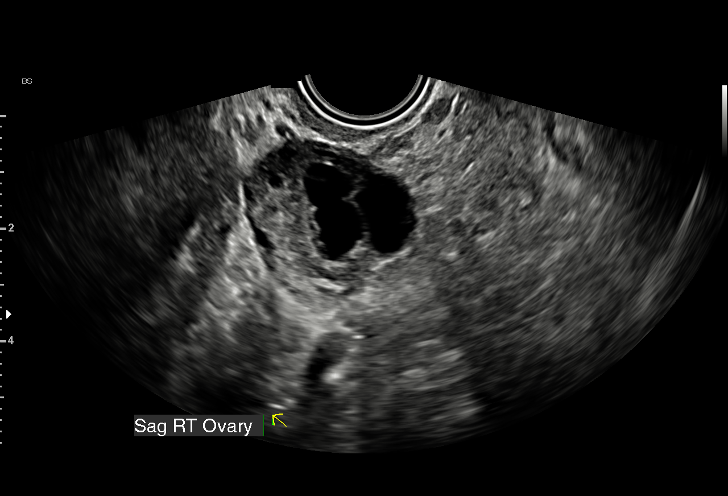
[im 29/34]
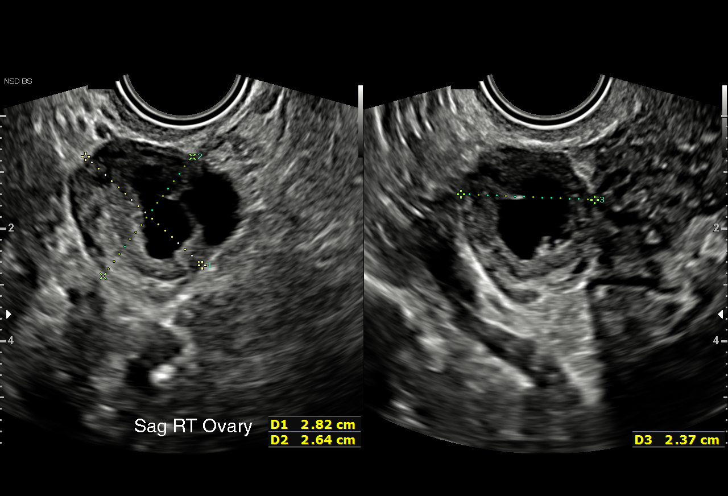
[im 31/34]
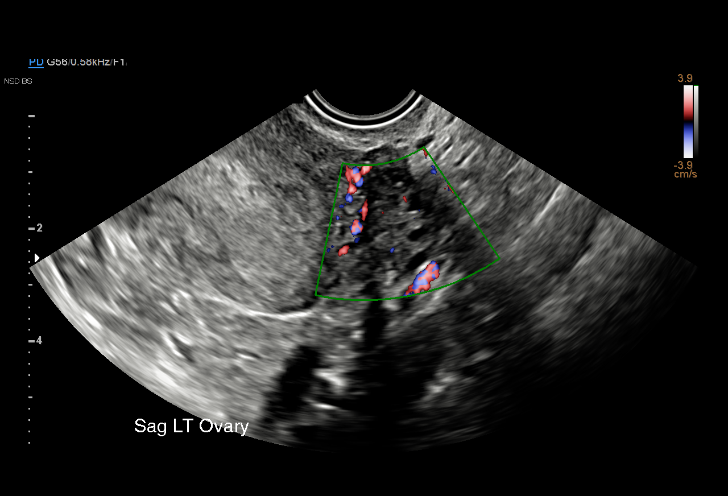
[im 34/34]
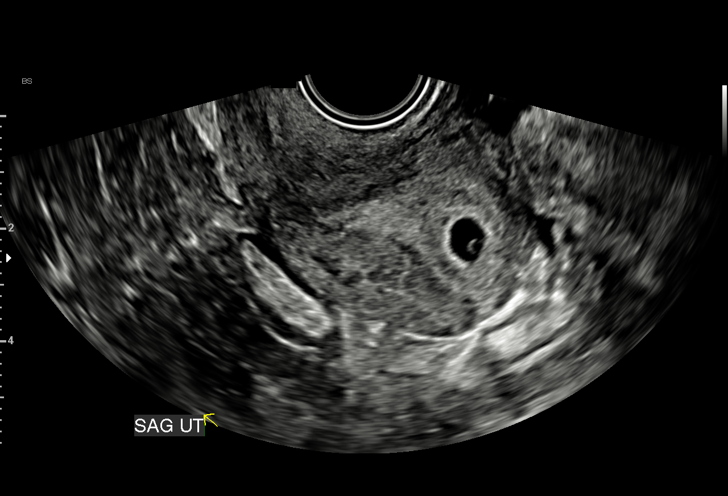

[15 of 28 positions shown; findings below may reference images not displayed]

FINDINGS: Intrauterine gestational sac: Single

Yolk sac:  Visualized.

Embryo:  Not Visualized.

MSD: 7.1 mm   5 w   3 d

Subchorionic hemorrhage:  None visualized.

Maternal uterus/adnexae: Left ovary measures 2.1 x 1.5 x 1.1 cm. The
right ovary measures 2.8 x 2.6 x 2.4 cm, with likely corpus luteum
cyst identified. No free fluid.
IMPRESSION: 1. Probable early intrauterine gestational sac and yolk sac, but no
fetal pole or cardiac activity yet visualized. Recommend follow-up
quantitative B-HCG levels and follow-up US in 14 days to assess
viability. This recommendation follows SRU consensus guidelines:
Diagnostic Criteria for Nonviable Pregnancy Early in the First
Trimester. N Engl J Med 9422; [DATE].
2. Probable corpus luteum cyst right ovary.

## 2024-11-21 ENCOUNTER — Other Ambulatory Visit: Payer: Self-pay

## 2024-11-21 ENCOUNTER — Emergency Department (HOSPITAL_COMMUNITY)
Admission: EM | Admit: 2024-11-21 | Discharge: 2024-11-21 | Disposition: A | Discharge status: Home / Self Care | Payer: Self-pay | Attending: Emergency Medicine | Admitting: Emergency Medicine

## 2024-11-21 DIAGNOSIS — S61311A Laceration without foreign body of left index finger with damage to nail, initial encounter: Secondary | ICD-10-CM

## 2024-11-21 MED ORDER — LIDOCAINE-EPINEPHRINE-TETRACAINE (LET) TOPICAL GEL
3.0000 mL | Freq: Once | TOPICAL | Status: AC
  Administered 2024-11-21: 3 mL via TOPICAL
  Filled 2024-11-21: qty 3

## 2024-11-21 MED ORDER — OXYCODONE-ACETAMINOPHEN 5-325 MG PO TABS
1.0000 | ORAL_TABLET | Freq: Once | ORAL | Status: AC
  Administered 2024-11-21: 1 via ORAL
  Filled 2024-11-21: qty 1

## 2024-11-21 NOTE — ED Triage Notes (Signed)
 Patient c/o laceration of left index finger. Active bleeding. Cleaned and pressure dressing in triage.

## 2024-11-21 NOTE — ED Provider Notes (Signed)
 [place] EMERGENCY DEPARTMENT AT [facility] Provider Note   CSN: [id number] Arrival date & time: 11/21/24  1919     Patient presents with: Laceration   Vanessa Nunez is a 28 y.o. female with overall noncontributory past medical history who presents after a laceration to her left index finger.  She reports that she was cutting cilantro and the knife slipped.  She is up-to-date on her tetanus, she reports updated around 2 or 3 years ago when she was last pregnant.    Laceration      Prior to Admission medications   Medication Sig Start Date End Date Taking? Authorizing Provider  albuterol  (VENTOLIN  HFA) 108 (90 Base) MCG/ACT inhaler Inhale 2 puffs into the lungs every 6 (six) hours as needed for wheezing or shortness of breath. 03/24/17   Isadora Krabbe D, PA  Prenatal Vit-Fe Fumarate-FA (MULTIVITAMIN-PRENATAL) 27-0.8 MG TABS tablet Take 1 tablet by mouth daily at 12 noon.    [provider]    Allergies: Peach flavoring agent (non-screening)    Review of Systems  All other systems reviewed and are negative.   Updated Vital Signs BP (!) 152/89 (BP Location: Right Arm)   Pulse 100   Temp 98 F (36.7 C) (Oral)   Resp 18   SpO2 100%   Physical Exam Vitals and nursing note reviewed.  Constitutional:      General: She is not in acute distress.    Appearance: Normal appearance.  HENT:     Head: Normocephalic and atraumatic.  Eyes:     General:        Right eye: No discharge.        Left eye: No discharge.  Cardiovascular:     Rate and Rhythm: Normal rate and regular rhythm.  Pulmonary:     Effort: Pulmonary effort is normal. No respiratory distress.  Musculoskeletal:        General: No deformity.  Skin:    General: Skin is warm and dry.     Comments: Patient with laceration to the distal tip of the left index finger involving.  Small loss of distal part of nail.  No exposed bone or tendon. Actively bleeding at time of eval.    Neurological:     Mental Status: She is alert and oriented to person, place, and time.  Psychiatric:        Mood and Affect: Mood normal.        Behavior: Behavior normal.     (all labs ordered are listed, but only abnormal results are displayed) Labs Reviewed - No data to display  EKG: None  Radiology: No results found.   Procedures   Medications Ordered in the ED  lidocaine -EPINEPHrine -tetracaine  (LET) topical gel (3 mLs Topical Given 11/21/24 1954)  oxyCODONE -acetaminophen  (PERCOCET/ROXICET) 5-325 MG per tablet 1 tablet (1 tablet Oral Given 11/21/24 1954)                                    Medical Decision Making  This is an overall well-appearing 28yo female who presents with a laceration of the left index finger, extremely distal amputation of just epidermis, dermis and 2mm sliver of distal nail.  The wound was explored through full range of motion, there is no evidence of foreign body, fascia rupture, damage to the deep vessels, capillary refill is intact distal to the site of the laceration.  Patient is up-to-date on their  tetanus..  They are neurovascularly intact throughout on my exam.  Because the wound was a very distal amputation there is no clinical indication for suture repair at this time.  Wound will heal by secondary intention.   Wound was extensively cleaned, and occlusive dressing with gauze was placed.  Patient tolerated the procedure without difficulty.  Patient instructed to monitor for signs of infection including worsening pain, redness, pus draining from the affected site.  Instructed to keep the wound clean, bandage, and change the bandage at least once daily.  Patient understands and agrees to the plan, and is discharged in stable condition at this time.  Final diagnoses:  Laceration of left index finger without foreign body with damage to nail, initial encounter    ED Discharge Orders     None          Rosan Sherlean VEAR DEVONNA 11/21/24  2057    Francesca Elsie CROME, MD 11/21/24 2111

## 2024-11-21 NOTE — Discharge Instructions (Signed)
 You can leave the bandage placed in the emergency department for 48 hours prior to changing it.  I would place a bandage as I showed you on the affected area and change it at least once daily, or more frequently if it becomes soiled. When changing bandage, clean the wound thoroughly with soap and water, and place a thin layer of Polysporin or Neosporin directly over top of the wound before replacing bandage. Please monitor for any signs of developing infection including worsening redness, pain, pus draining from the affected site.  If you are concerned about any developing infection I recommend that you return for further evaluation for management.  If you are concerned about the way the wound is healing you can follow-up with the hand surgeon whose contact information I provided above
